# Patient Record
Sex: Female | Born: 1995 | Race: Black or African American | Hispanic: No | Marital: Single | Smoking: Never smoker
Health system: Southern US, Community
[De-identification: ages and names within clinical notes are randomized; demographics above are authoritative.]

## PROBLEM LIST (undated history)

## (undated) DIAGNOSIS — O139 Gestational [pregnancy-induced] hypertension without significant proteinuria, unspecified trimester: Secondary | ICD-10-CM

## (undated) HISTORY — PX: HERNIA REPAIR: SHX51

## (undated) HISTORY — DX: Gestational (pregnancy-induced) hypertension without significant proteinuria, unspecified trimester: O13.9

---

## 2018-01-25 DIAGNOSIS — O09299 Supervision of pregnancy with other poor reproductive or obstetric history, unspecified trimester: Secondary | ICD-10-CM | POA: Insufficient documentation

## 2018-01-25 DIAGNOSIS — R2231 Localized swelling, mass and lump, right upper limb: Secondary | ICD-10-CM | POA: Insufficient documentation

## 2018-01-25 DIAGNOSIS — O09899 Supervision of other high risk pregnancies, unspecified trimester: Secondary | ICD-10-CM | POA: Insufficient documentation

## 2018-01-25 DIAGNOSIS — R7989 Other specified abnormal findings of blood chemistry: Secondary | ICD-10-CM | POA: Insufficient documentation

## 2018-04-06 DIAGNOSIS — R2231 Localized swelling, mass and lump, right upper limb: Secondary | ICD-10-CM | POA: Diagnosis not present

## 2018-04-06 DIAGNOSIS — Z331 Pregnant state, incidental: Secondary | ICD-10-CM | POA: Diagnosis not present

## 2018-06-15 DIAGNOSIS — O2243 Hemorrhoids in pregnancy, third trimester: Secondary | ICD-10-CM | POA: Diagnosis not present

## 2018-06-15 DIAGNOSIS — R2231 Localized swelling, mass and lump, right upper limb: Secondary | ICD-10-CM | POA: Diagnosis not present

## 2018-06-15 DIAGNOSIS — O99013 Anemia complicating pregnancy, third trimester: Secondary | ICD-10-CM | POA: Diagnosis not present

## 2018-06-15 DIAGNOSIS — D649 Anemia, unspecified: Secondary | ICD-10-CM | POA: Diagnosis not present

## 2018-06-15 DIAGNOSIS — E669 Obesity, unspecified: Secondary | ICD-10-CM | POA: Diagnosis not present

## 2018-06-15 DIAGNOSIS — Z3685 Encounter for antenatal screening for Streptococcus B: Secondary | ICD-10-CM | POA: Diagnosis not present

## 2018-06-15 DIAGNOSIS — O09213 Supervision of pregnancy with history of pre-term labor, third trimester: Secondary | ICD-10-CM | POA: Diagnosis not present

## 2018-06-15 DIAGNOSIS — O26893 Other specified pregnancy related conditions, third trimester: Secondary | ICD-10-CM | POA: Diagnosis not present

## 2018-06-15 DIAGNOSIS — O09293 Supervision of pregnancy with other poor reproductive or obstetric history, third trimester: Secondary | ICD-10-CM | POA: Diagnosis not present

## 2018-06-15 DIAGNOSIS — O99213 Obesity complicating pregnancy, third trimester: Secondary | ICD-10-CM | POA: Diagnosis not present

## 2018-06-15 DIAGNOSIS — Z3A37 37 weeks gestation of pregnancy: Secondary | ICD-10-CM | POA: Diagnosis not present

## 2018-06-15 DIAGNOSIS — Z3A36 36 weeks gestation of pregnancy: Secondary | ICD-10-CM | POA: Diagnosis not present

## 2018-06-15 DIAGNOSIS — Z7982 Long term (current) use of aspirin: Secondary | ICD-10-CM | POA: Diagnosis not present

## 2018-06-22 DIAGNOSIS — Z7982 Long term (current) use of aspirin: Secondary | ICD-10-CM | POA: Diagnosis not present

## 2018-06-22 DIAGNOSIS — O2243 Hemorrhoids in pregnancy, third trimester: Secondary | ICD-10-CM | POA: Diagnosis not present

## 2018-06-22 DIAGNOSIS — E669 Obesity, unspecified: Secondary | ICD-10-CM | POA: Diagnosis not present

## 2018-06-22 DIAGNOSIS — O09293 Supervision of pregnancy with other poor reproductive or obstetric history, third trimester: Secondary | ICD-10-CM | POA: Diagnosis not present

## 2018-06-22 DIAGNOSIS — D649 Anemia, unspecified: Secondary | ICD-10-CM | POA: Diagnosis not present

## 2018-06-22 DIAGNOSIS — R2231 Localized swelling, mass and lump, right upper limb: Secondary | ICD-10-CM | POA: Diagnosis not present

## 2018-06-22 DIAGNOSIS — O09213 Supervision of pregnancy with history of pre-term labor, third trimester: Secondary | ICD-10-CM | POA: Diagnosis not present

## 2018-06-22 DIAGNOSIS — Z3A37 37 weeks gestation of pregnancy: Secondary | ICD-10-CM | POA: Diagnosis not present

## 2018-06-22 DIAGNOSIS — O99013 Anemia complicating pregnancy, third trimester: Secondary | ICD-10-CM | POA: Diagnosis not present

## 2018-06-22 DIAGNOSIS — O99213 Obesity complicating pregnancy, third trimester: Secondary | ICD-10-CM | POA: Diagnosis not present

## 2018-06-22 DIAGNOSIS — O26893 Other specified pregnancy related conditions, third trimester: Secondary | ICD-10-CM | POA: Diagnosis not present

## 2018-06-22 DIAGNOSIS — Z3A36 36 weeks gestation of pregnancy: Secondary | ICD-10-CM | POA: Diagnosis not present

## 2018-06-22 DIAGNOSIS — Z3685 Encounter for antenatal screening for Streptococcus B: Secondary | ICD-10-CM | POA: Diagnosis not present

## 2018-07-06 DIAGNOSIS — O09299 Supervision of pregnancy with other poor reproductive or obstetric history, unspecified trimester: Secondary | ICD-10-CM | POA: Insufficient documentation

## 2018-07-07 DIAGNOSIS — Z3A40 40 weeks gestation of pregnancy: Secondary | ICD-10-CM | POA: Diagnosis not present

## 2018-07-07 DIAGNOSIS — O480 Post-term pregnancy: Secondary | ICD-10-CM | POA: Diagnosis not present

## 2018-07-07 DIAGNOSIS — O4103X0 Oligohydramnios, third trimester, not applicable or unspecified: Secondary | ICD-10-CM | POA: Diagnosis not present

## 2018-09-26 DIAGNOSIS — Z7982 Long term (current) use of aspirin: Secondary | ICD-10-CM | POA: Diagnosis not present

## 2018-09-26 DIAGNOSIS — J020 Streptococcal pharyngitis: Secondary | ICD-10-CM | POA: Diagnosis not present

## 2018-11-22 DIAGNOSIS — N760 Acute vaginitis: Secondary | ICD-10-CM | POA: Diagnosis not present

## 2018-11-22 DIAGNOSIS — B9689 Other specified bacterial agents as the cause of diseases classified elsewhere: Secondary | ICD-10-CM | POA: Diagnosis not present

## 2018-11-22 DIAGNOSIS — N72 Inflammatory disease of cervix uteri: Secondary | ICD-10-CM | POA: Diagnosis not present

## 2019-02-28 DIAGNOSIS — H6692 Otitis media, unspecified, left ear: Secondary | ICD-10-CM | POA: Diagnosis not present

## 2019-02-28 DIAGNOSIS — R0981 Nasal congestion: Secondary | ICD-10-CM | POA: Diagnosis not present

## 2019-06-26 DIAGNOSIS — J028 Acute pharyngitis due to other specified organisms: Secondary | ICD-10-CM | POA: Diagnosis not present

## 2019-06-26 DIAGNOSIS — Z131 Encounter for screening for diabetes mellitus: Secondary | ICD-10-CM | POA: Diagnosis not present

## 2019-06-26 DIAGNOSIS — R5383 Other fatigue: Secondary | ICD-10-CM | POA: Diagnosis not present

## 2019-06-26 DIAGNOSIS — Z114 Encounter for screening for human immunodeficiency virus [HIV]: Secondary | ICD-10-CM | POA: Diagnosis not present

## 2019-06-26 DIAGNOSIS — Z0000 Encounter for general adult medical examination without abnormal findings: Secondary | ICD-10-CM | POA: Diagnosis not present

## 2019-06-26 DIAGNOSIS — E559 Vitamin D deficiency, unspecified: Secondary | ICD-10-CM | POA: Diagnosis not present

## 2019-06-26 DIAGNOSIS — Z13220 Encounter for screening for lipoid disorders: Secondary | ICD-10-CM | POA: Diagnosis not present

## 2019-07-10 DIAGNOSIS — R87610 Atypical squamous cells of undetermined significance on cytologic smear of cervix (ASC-US): Secondary | ICD-10-CM | POA: Diagnosis not present

## 2019-07-10 DIAGNOSIS — E559 Vitamin D deficiency, unspecified: Secondary | ICD-10-CM | POA: Diagnosis not present

## 2019-07-10 DIAGNOSIS — Z01419 Encounter for gynecological examination (general) (routine) without abnormal findings: Secondary | ICD-10-CM | POA: Diagnosis not present

## 2019-07-10 DIAGNOSIS — L404 Guttate psoriasis: Secondary | ICD-10-CM | POA: Diagnosis not present

## 2019-07-10 DIAGNOSIS — Z7251 High risk heterosexual behavior: Secondary | ICD-10-CM | POA: Diagnosis not present

## 2019-07-13 DIAGNOSIS — L42 Pityriasis rosea: Secondary | ICD-10-CM | POA: Diagnosis not present

## 2019-07-13 DIAGNOSIS — L300 Nummular dermatitis: Secondary | ICD-10-CM | POA: Diagnosis not present

## 2019-08-11 DIAGNOSIS — R87610 Atypical squamous cells of undetermined significance on cytologic smear of cervix (ASC-US): Secondary | ICD-10-CM | POA: Diagnosis not present

## 2019-08-11 DIAGNOSIS — E559 Vitamin D deficiency, unspecified: Secondary | ICD-10-CM | POA: Diagnosis not present

## 2019-09-21 DIAGNOSIS — H52223 Regular astigmatism, bilateral: Secondary | ICD-10-CM | POA: Diagnosis not present

## 2019-09-21 DIAGNOSIS — H5213 Myopia, bilateral: Secondary | ICD-10-CM | POA: Diagnosis not present

## 2019-10-21 DIAGNOSIS — H5213 Myopia, bilateral: Secondary | ICD-10-CM | POA: Diagnosis not present

## 2019-10-21 DIAGNOSIS — H52223 Regular astigmatism, bilateral: Secondary | ICD-10-CM | POA: Diagnosis not present

## 2019-11-19 DIAGNOSIS — Z6838 Body mass index (BMI) 38.0-38.9, adult: Secondary | ICD-10-CM | POA: Diagnosis not present

## 2019-11-19 DIAGNOSIS — H8110 Benign paroxysmal vertigo, unspecified ear: Secondary | ICD-10-CM | POA: Diagnosis not present

## 2020-02-16 DIAGNOSIS — Z3200 Encounter for pregnancy test, result unknown: Secondary | ICD-10-CM | POA: Diagnosis not present

## 2020-04-30 ENCOUNTER — Ambulatory Visit
Admission: EM | Admit: 2020-04-30 | Discharge: 2020-04-30 | Disposition: A | Discharge status: Home / Self Care | Payer: BC Managed Care – PPO | Attending: Emergency Medicine | Admitting: Emergency Medicine

## 2020-04-30 DIAGNOSIS — K649 Unspecified hemorrhoids: Secondary | ICD-10-CM | POA: Insufficient documentation

## 2020-04-30 DIAGNOSIS — N760 Acute vaginitis: Secondary | ICD-10-CM | POA: Insufficient documentation

## 2020-04-30 MED ORDER — FLUCONAZOLE 200 MG PO TABS: 200.0000 mg | ORAL_TABLET | Freq: Once | ORAL | 0 refills | Status: AC

## 2020-04-30 NOTE — ED Triage Notes (Signed)
Pt c/o white clumpy vaginal discharge with no odor for the past 2 days. Denies urinary difficulty and un protective intercourse.

## 2020-04-30 NOTE — ED Provider Notes (Signed)
[facility]    CSN: [id number] Arrival date & time: 04/30/20  1752      History   Chief Complaint Chief Complaint  Patient presents with  . Vaginal Discharge    HPI Suzanne Elliott is a 24 y.o. female without significant medical history presenting for 2-day course of clumpy vaginal discharge without odor.  Unsure of color.  Does endorse vaginal irritation, pruritus.  LMP 04/17/2020.  Patient currently sexually active with 1 female partner, not routinely using condoms.  Denies vaginal or pelvic pain, abdominal pain, back pain, fever.  No urinary frequency, urgency, dysuria.    History reviewed. No pertinent past medical history.  There are no problems to display for this patient.   History reviewed. No pertinent surgical history.  OB History   No obstetric history on file.      Home Medications    Prior to Admission medications   Not on File    Family History History reviewed. No pertinent family history.  Social History Social History   Tobacco Use  . Smoking status: Never Smoker  . Smokeless tobacco: Never Used  Substance Use Topics  . Alcohol use: Not Currently  . Drug use: Not Currently     Allergies   Patient has no known allergies.   Review of Systems As per HPI   Physical Exam Triage Vital Signs ED Triage Vitals  Enc Vitals Group     BP 04/30/20 1831 (!) 139/94     Pulse Rate 04/30/20 1831 85     Resp 04/30/20 1831 16     Temp 04/30/20 1831 98.3 F (36.8 C)     Temp Source 04/30/20 1831 Oral     SpO2 04/30/20 1831 100 %     Weight --      Height --      Head Circumference --      Peak Flow --      Pain Score 04/30/20 1846 6     Pain Loc --      Pain Edu? --      Excl. in [place]? --    No data found.  Updated Vital Signs BP (!) 139/94 (BP Location: Left Arm)   Pulse 85   Temp 98.3 F (36.8 C) (Oral)   Resp 16   LMP 04/17/2020   SpO2 100%   Visual Acuity Right Eye Distance:   Left Eye Distance:   Bilateral  Distance:    Right Eye Near:   Left Eye Near:    Bilateral Near:     Physical Exam Constitutional:      General: She is not in acute distress. HENT:     Head: Normocephalic and atraumatic.  Eyes:     General: No scleral icterus.    Pupils: Pupils are equal, round, and reactive to light.  Cardiovascular:     Rate and Rhythm: Normal rate.  Pulmonary:     Effort: Pulmonary effort is normal.  Abdominal:     General: Bowel sounds are normal.     Palpations: Abdomen is soft.     Tenderness: There is no abdominal tenderness. There is no right CVA tenderness, left CVA tenderness or guarding.  Genitourinary:    Comments: Patient declined, self-swab performed Skin:    Coloration: Skin is not jaundiced or pale.  Neurological:     Mental Status: She is alert and oriented to person, place, and time.      UC Treatments / Results  Labs (all labs ordered are  listed, but only abnormal results are displayed) Labs Reviewed  CERVICOVAGINAL ANCILLARY ONLY    EKG   Radiology No results found.  Procedures Procedures (including critical care time)  Medications Ordered in UC Medications - No data to display  Initial Impression / Assessment and Plan / UC Course  I have reviewed the triage vital signs and the nursing notes.  Pertinent labs & imaging results that were available during my care of the patient were reviewed by me and considered in my medical decision making (see chart for details).     Patient appears well in office today.  Declined pelvic exam: cytology pending, will treat for yeast vaginitis in the interim given history.  Return precautions discussed, patient verbalized understanding and is agreeable to plan. Final Clinical Impressions(s) / UC Diagnoses   Final diagnoses:  Acute vaginitis  Hemorrhoids, unspecified hemorrhoid type     Discharge Instructions     Take diflucan today, another in 3 days if needed.  Testing for chlamydia, gonorrhea, trichomonas is  pending: please look for these results on the MyChart app/website.  We will notify you if you are positive and outline treatment at that time.  Important to avoid all forms of sexual intercourse (oral, vaginal, anal) with any/all partners for the next 7 days to avoid spreading/reinfecting. Any/all sexual partners should be notified of testing/treatment today.  Return for persistent/worsening symptoms or if you develop fever, abdominal or pelvic pain, blood in your urine, or are re-exposed to an STI.    ED Prescriptions    Medication Sig Dispense Auth. Provider   fluconazole (DIFLUCAN) 200 MG tablet Take 1 tablet (200 mg total) by mouth once for 1 dose. May repeat in 72 hours if needed 2 tablet Hall-Potvin, [place], PA-C     PDMP not reviewed this encounter.   Hall-Potvin, [place], [place] 05/01/20 1245

## 2020-04-30 NOTE — Discharge Instructions (Signed)
Take diflucan today, another in 3 days if needed.  Testing for chlamydia, gonorrhea, trichomonas is pending: please look for these results on the MyChart app/website.  We will notify you if you are positive and outline treatment at that time.  Important to avoid all forms of sexual intercourse (oral, vaginal, anal) with any/all partners for the next 7 days to avoid spreading/reinfecting. Any/all sexual partners should be notified of testing/treatment today.  Return for persistent/worsening symptoms or if you develop fever, abdominal or pelvic pain, blood in your urine, or are re-exposed to an STI.

## 2020-05-01 ENCOUNTER — Encounter: Payer: Self-pay | Admitting: Emergency Medicine

## 2020-05-02 LAB — CERVICOVAGINAL ANCILLARY ONLY
Bacterial Vaginitis (gardnerella): NEGATIVE
Chlamydia: NEGATIVE
Comment: NEGATIVE
Comment: NEGATIVE
Comment: NEGATIVE
Comment: NORMAL
Neisseria Gonorrhea: NEGATIVE
Trichomonas: NEGATIVE

## 2020-05-29 ENCOUNTER — Ambulatory Visit
Admission: EM | Admit: 2020-05-29 | Discharge: 2020-05-29 | Disposition: A | Discharge status: Home / Self Care | Payer: BC Managed Care – PPO | Attending: Physician Assistant | Admitting: Physician Assistant

## 2020-05-29 DIAGNOSIS — R197 Diarrhea, unspecified: Secondary | ICD-10-CM

## 2020-05-29 DIAGNOSIS — R112 Nausea with vomiting, unspecified: Secondary | ICD-10-CM | POA: Diagnosis not present

## 2020-05-29 MED ORDER — DICYCLOMINE HCL 20 MG PO TABS
20.0000 mg | ORAL_TABLET | Freq: Two times a day (BID) | ORAL | 0 refills | Status: DC
Start: 2020-05-29 — End: 2021-02-06

## 2020-05-29 MED ORDER — ONDANSETRON 4 MG PO TBDP
4.0000 mg | ORAL_TABLET | Freq: Three times a day (TID) | ORAL | 0 refills | Status: DC | PRN
Start: 2020-05-29 — End: 2021-02-06

## 2020-05-29 NOTE — ED Provider Notes (Signed)
[facility]    CSN: [id number] Arrival date & time: 05/29/20  0840      History   Chief Complaint Chief Complaint  Patient presents with  . Emesis    HPI Suzanne Elliott is a 24 y.o. female.   24 year old female comes in for 2 day history of nausea/vomiting/diarrhea. NBNB vomiting that was controlled yesterday until trying food intake this morning. Now also having diarrhea without melena, hematochezia. Abdominal soreness to the umbilical area, worse with BM. Denies fevers, URI symptoms. Children with similar symptoms.      History reviewed. No pertinent past medical history.  There are no problems to display for this patient.   History reviewed. No pertinent surgical history.  OB History   No obstetric history on file.      Home Medications    Prior to Admission medications   Medication Sig Start Date End Date Taking? Authorizing Provider  dicyclomine (BENTYL) 20 MG tablet Take 1 tablet (20 mg total) by mouth 2 (two) times daily. 05/29/20   Tasia Catchings, Shatoya Roets V, PA-C  ondansetron (ZOFRAN ODT) 4 MG disintegrating tablet Take 1 tablet (4 mg total) by mouth every 8 (eight) hours as needed for nausea or vomiting. 05/29/20   Ok Edwards, PA-C    Family History History reviewed. No pertinent family history.  Social History Social History   Tobacco Use  . Smoking status: Never Smoker  . Smokeless tobacco: Never Used  Substance Use Topics  . Alcohol use: Not Currently  . Drug use: Not Currently     Allergies   Patient has no known allergies.   Review of Systems Review of Systems  Reason unable to perform ROS: See HPI as above.     Physical Exam Triage Vital Signs ED Triage Vitals  Enc Vitals Group     BP 05/29/20 0845 129/85     Pulse Rate 05/29/20 0845 91     Resp 05/29/20 0845 16     Temp 05/29/20 0845 98.7 F (37.1 C)     Temp Source 05/29/20 0845 Oral     SpO2 05/29/20 0845 98 %     Weight --      Height --      Head Circumference --       Peak Flow --      Pain Score 05/29/20 0852 0     Pain Loc --      Pain Edu? --      Excl. in [place]? --    No data found.  Updated Vital Signs BP 129/85 (BP Location: Right Arm)   Pulse 91   Temp 98.7 F (37.1 C) (Oral)   Resp 16   LMP 05/17/2020   SpO2 98%   Visual Acuity Right Eye Distance:   Left Eye Distance:   Bilateral Distance:    Right Eye Near:   Left Eye Near:    Bilateral Near:     Physical Exam Constitutional:      General: She is not in acute distress.    Appearance: She is well-developed. She is not ill-appearing, toxic-appearing or diaphoretic.  HENT:     Head: Normocephalic and atraumatic.  Eyes:     Conjunctiva/sclera: Conjunctivae normal.     Pupils: Pupils are equal, round, and reactive to light.  Cardiovascular:     Rate and Rhythm: Normal rate and regular rhythm.  Pulmonary:     Effort: Pulmonary effort is normal. No respiratory distress.     Comments:  LCTAB Abdominal:     General: Bowel sounds are normal.     Palpations: Abdomen is soft.     Tenderness: There is generalized abdominal tenderness. There is no guarding or rebound.  Musculoskeletal:     Cervical back: Normal range of motion and neck supple.  Skin:    General: Skin is warm and dry.  Neurological:     Mental Status: She is alert and oriented to person, place, and time.  Psychiatric:        Behavior: Behavior normal.        Judgment: Judgment normal.      UC Treatments / Results  Labs (all labs ordered are listed, but only abnormal results are displayed) Labs Reviewed - No data to display  EKG   Radiology No results found.  Procedures Procedures (including critical care time)  Medications Ordered in UC Medications - No data to display  Initial Impression / Assessment and Plan / UC Course  I have reviewed the triage vital signs and the nursing notes.  Pertinent labs & imaging results that were available during my care of the patient were reviewed by me and  considered in my medical decision making (see chart for details).    Discussed with patient no alarming signs on exam. Zofran, bentyl PRN. Push fluids. Bland diet, advance as tolerated. Return precautions given.  Final Clinical Impressions(s) / UC Diagnoses   Final diagnoses:  Nausea vomiting and diarrhea   ED Prescriptions    Medication Sig Dispense Auth. Provider   ondansetron (ZOFRAN ODT) 4 MG disintegrating tablet Take 1 tablet (4 mg total) by mouth every 8 (eight) hours as needed for nausea or vomiting. 20 tablet Julietta Batterman V, PA-C   dicyclomine (BENTYL) 20 MG tablet Take 1 tablet (20 mg total) by mouth 2 (two) times daily. 20 tablet Belinda Fisher, PA-C     PDMP not reviewed this encounter.   Belinda Fisher, PA-C 05/29/20 [phone]

## 2020-05-29 NOTE — ED Triage Notes (Signed)
Pt states vomiting x2 yesterday, once this am and now having diarrhea.

## 2020-05-29 NOTE — Discharge Instructions (Signed)
Zofran for nausea and vomiting as needed. Bentyl for abdominal cramping/diarrhea as needed. Keep hydrated, you urine should be clear to pale yellow in color. Bland diet, advance as tolerated. Monitor for any worsening of symptoms, nausea or vomiting not controlled by medication, worsening abdominal pain, fever, go to the emergency department for further evaluation needed.

## 2020-07-20 ENCOUNTER — Ambulatory Visit
Admission: EM | Admit: 2020-07-20 | Discharge: 2020-07-20 | Disposition: A | Discharge status: Home / Self Care | Payer: BC Managed Care – PPO | Attending: Physician Assistant | Admitting: Physician Assistant

## 2020-07-20 ENCOUNTER — Encounter: Payer: Self-pay | Admitting: Emergency Medicine

## 2020-07-20 ENCOUNTER — Other Ambulatory Visit: Payer: Self-pay

## 2020-07-20 DIAGNOSIS — H1031 Unspecified acute conjunctivitis, right eye: Secondary | ICD-10-CM

## 2020-07-20 MED ORDER — OFLOXACIN 0.3 % OP SOLN: OPHTHALMIC | 0 refills | Status: DC

## 2020-07-20 NOTE — ED Triage Notes (Signed)
Pt here with right eye irritation and watering x 2 days

## 2020-07-20 NOTE — Discharge Instructions (Signed)
Use ofloxacin eyedrops as directed on right eye. Artificial tear gel (systane, genteal) at night. Wait 10-15 minutes between drops, always use artificial tear gel last, as it prevents drops from penetrating through. Monitor for any worsening of symptoms, changes in vision, sensitivity to light, eye swelling, painful eye movement, follow up with ophthalmology for further evaluation.

## 2020-07-20 NOTE — ED Provider Notes (Signed)
[facility]    CSN: [id number] Arrival date & time: 07/20/20  1148      History   Chief Complaint Chief Complaint  Patient presents with  . Conjunctivitis    HPI Suzanne Elliott is a 24 y.o. female.   24 year old female comes in for 2 day history of right eye irritation, redness, crusting. Denies vision changes, photophobia. Denies URI symptoms, fever. Wears contacts, monthly, current pair approx 20 days in. Does not wear over night.      History reviewed. No pertinent past medical history.  There are no problems to display for this patient.   History reviewed. No pertinent surgical history.  OB History   No obstetric history on file.      Home Medications    Prior to Admission medications   Medication Sig Start Date End Date Taking? Authorizing Provider  dicyclomine (BENTYL) 20 MG tablet Take 1 tablet (20 mg total) by mouth 2 (two) times daily. 05/29/20   Cathie Hoops, [place] V, PA-C  ofloxacin (OCUFLOX) 0.3 % ophthalmic solution 1 drop every 2 hrs for 2 days, then 4 times a day for the next 5 days 07/20/20   Cathie Hoops, Naba Sneed V, PA-C  ondansetron (ZOFRAN ODT) 4 MG disintegrating tablet Take 1 tablet (4 mg total) by mouth every 8 (eight) hours as needed for nausea or vomiting. 05/29/20   Belinda Fisher, PA-C    Family History Family History  Family history unknown: Yes    Social History Social History   Tobacco Use  . Smoking status: Never Smoker  . Smokeless tobacco: Never Used  Substance Use Topics  . Alcohol use: Not Currently  . Drug use: Not Currently     Allergies   Patient has no known allergies.   Review of Systems Review of Systems  Reason unable to perform ROS: See HPI as above.     Physical Exam Triage Vital Signs ED Triage Vitals  Enc Vitals Group     BP 07/20/20 1201 (!) 136/94     Pulse Rate 07/20/20 1201 81     Resp 07/20/20 1201 18     Temp 07/20/20 1201 98.7 F (37.1 C)     Temp Source 07/20/20 1201 Oral     SpO2 07/20/20 1201 98 %      Weight --      Height --      Head Circumference --      Peak Flow --      Pain Score 07/20/20 1205 3     Pain Loc --      Pain Edu? --      Excl. in [place]? --    No data found.  Updated Vital Signs BP (!) 136/94 (BP Location: Left Arm)   Pulse 81   Temp 98.7 F (37.1 C) (Oral)   Resp 18   SpO2 98%   Visual Acuity Right Eye Distance: 20/25 Left Eye Distance: 20/20 Bilateral Distance: 20/20  Right Eye Near:   Left Eye Near:    Bilateral Near:     Physical Exam Constitutional:      General: She is not in acute distress.    Appearance: She is well-developed. She is not ill-appearing, toxic-appearing or diaphoretic.  HENT:     Head: Normocephalic and atraumatic.  Eyes:     General: Lids are normal. Lids are everted, no foreign bodies appreciated.     Extraocular Movements: Extraocular movements intact.     Conjunctiva/sclera:  Right eye: Right conjunctiva is injected.     Left eye: Left conjunctiva is not injected.     Pupils: Pupils are equal, round, and reactive to light.     Comments: Fluorescein stain without uptake.  Neurological:     Mental Status: She is alert and oriented to person, place, and time.      UC Treatments / Results  Labs (all labs ordered are listed, but only abnormal results are displayed) Labs Reviewed - No data to display  EKG   Radiology No results found.  Procedures Procedures (including critical care time)  Medications Ordered in UC Medications - No data to display  Initial Impression / Assessment and Plan / UC Course  I have reviewed the triage vital signs and the nursing notes.  Pertinent labs & imaging results that were available during my care of the patient were reviewed by me and considered in my medical decision making (see chart for details).    Start ofloxacin drops as directed. Artificial tears gel as directed. Patient to refrain from contact lens use until symptoms resolve, to switch to new pair of contacts when  restarting. Patient to follow up with ophthalmology if symptoms worsens or does not improve. Return precautions given.   Final Clinical Impressions(s) / UC Diagnoses   Final diagnoses:  Acute bacterial conjunctivitis of right eye    ED Prescriptions    Medication Sig Dispense Auth. Provider   ofloxacin (OCUFLOX) 0.3 % ophthalmic solution 1 drop every 2 hrs for 2 days, then 4 times a day for the next 5 days 5 mL Belinda Fisher, PA-C     PDMP not reviewed this encounter.   Belinda Fisher, PA-C 07/20/20 1320

## 2020-08-13 DIAGNOSIS — Z114 Encounter for screening for human immunodeficiency virus [HIV]: Secondary | ICD-10-CM | POA: Diagnosis not present

## 2020-08-13 DIAGNOSIS — Z23 Encounter for immunization: Secondary | ICD-10-CM | POA: Diagnosis not present

## 2020-08-13 DIAGNOSIS — Z131 Encounter for screening for diabetes mellitus: Secondary | ICD-10-CM | POA: Diagnosis not present

## 2020-08-13 DIAGNOSIS — E559 Vitamin D deficiency, unspecified: Secondary | ICD-10-CM | POA: Diagnosis not present

## 2020-08-13 DIAGNOSIS — Z0000 Encounter for general adult medical examination without abnormal findings: Secondary | ICD-10-CM | POA: Diagnosis not present

## 2020-08-13 DIAGNOSIS — R5383 Other fatigue: Secondary | ICD-10-CM | POA: Diagnosis not present

## 2020-08-13 DIAGNOSIS — Z13220 Encounter for screening for lipoid disorders: Secondary | ICD-10-CM | POA: Diagnosis not present

## 2020-08-24 DIAGNOSIS — R0602 Shortness of breath: Secondary | ICD-10-CM | POA: Diagnosis not present

## 2020-08-24 DIAGNOSIS — E559 Vitamin D deficiency, unspecified: Secondary | ICD-10-CM | POA: Diagnosis not present

## 2020-08-24 DIAGNOSIS — Z79899 Other long term (current) drug therapy: Secondary | ICD-10-CM | POA: Diagnosis not present

## 2020-08-24 DIAGNOSIS — R635 Abnormal weight gain: Secondary | ICD-10-CM | POA: Diagnosis not present

## 2020-08-24 DIAGNOSIS — L089 Local infection of the skin and subcutaneous tissue, unspecified: Secondary | ICD-10-CM | POA: Diagnosis not present

## 2020-08-24 DIAGNOSIS — E0590 Thyrotoxicosis, unspecified without thyrotoxic crisis or storm: Secondary | ICD-10-CM | POA: Diagnosis not present

## 2020-10-14 ENCOUNTER — Emergency Department (HOSPITAL_BASED_OUTPATIENT_CLINIC_OR_DEPARTMENT_OTHER): Payer: BC Managed Care – PPO

## 2020-10-14 ENCOUNTER — Encounter (HOSPITAL_COMMUNITY): Payer: Self-pay

## 2020-10-14 ENCOUNTER — Other Ambulatory Visit: Payer: Self-pay

## 2020-10-14 ENCOUNTER — Emergency Department (HOSPITAL_COMMUNITY)
Admission: EM | Admit: 2020-10-14 | Discharge: 2020-10-14 | Disposition: A | Discharge status: Home / Self Care | Payer: BC Managed Care – PPO | Attending: Emergency Medicine | Admitting: Emergency Medicine

## 2020-10-14 DIAGNOSIS — M79652 Pain in left thigh: Secondary | ICD-10-CM | POA: Diagnosis not present

## 2020-10-14 DIAGNOSIS — M7989 Other specified soft tissue disorders: Secondary | ICD-10-CM

## 2020-10-14 NOTE — Progress Notes (Signed)
Left lower extremity venous duplex has been completed. Preliminary results can be found in CV Proc through chart review.  Results were given to Dr. Clarene Duke.  10/14/20 11:26 AM Olen Cordial RVT

## 2020-10-14 NOTE — ED Triage Notes (Signed)
Pt was bit by a spider on her left knee 2 weeks ago, seen at an UC and given antibiotic in which she has finished. Pt now c.o pain traveling up her leg into her thigh for the past week. Pt ambulatory.

## 2020-10-14 NOTE — ED Notes (Signed)
Pt discharged to home and instructed to follow up with primary care. Pt verbalized understanding of written and verbal discharge instructions provided and all questions addressed. Pt ambulated out of ER with steady gait; no distress noted.  

## 2020-10-14 NOTE — ED Notes (Signed)
Ultrasound done at bedside

## 2020-10-14 NOTE — ED Provider Notes (Signed)
[facility] EMERGENCY DEPARTMENT Provider Note   CSN: [id number] Arrival date & time: 10/14/20  5397     History Chief Complaint  Patient presents with  . Leg Pain    Suzanne Elliott is a 24 y.o. female.  [name] who p/w L leg pain. Pt states that at the beginning of September, she had a "spider bite" on her L leg near her knee. She was seen at [facility] and given antibiotics; was re-evaluated later and they extended the antibiotic course. This area has since healed up at she finished abx ~2 weeks ago. Over the past week, she has had progressively worsening pain in her left leg involving thigh that is worse w/ ambulation. She denies any distal pain or numbness. No fevers or recent illness. Denies h/o blood clots. Is on OCPs.  The history is provided by the patient.  Leg Pain Associated symptoms: no fever        History reviewed. No pertinent past medical history.  There are no problems to display for this patient.   Past Surgical History:  Procedure Laterality Date  . HERNIA REPAIR     in third grade     OB History   No obstetric history on file.     Family History  Family history unknown: Yes    Social History   Tobacco Use  . Smoking status: Never Smoker  . Smokeless tobacco: Never Used  Substance Use Topics  . Alcohol use: Not Currently  . Drug use: Not Currently    Home Medications Prior to Admission medications   Medication Sig Start Date End Date Taking? Authorizing Provider  dicyclomine (BENTYL) 20 MG tablet Take 1 tablet (20 mg total) by mouth 2 (two) times daily. 05/29/20   Cathie Hoops, Amy V, PA-C  ofloxacin (OCUFLOX) 0.3 % ophthalmic solution 1 drop every 2 hrs for 2 days, then 4 times a day for the next 5 days 07/20/20   Cathie Hoops, Amy V, PA-C  ondansetron (ZOFRAN ODT) 4 MG disintegrating tablet Take 1 tablet (4 mg total) by mouth every 8 (eight) hours as needed for nausea or vomiting. 05/29/20   Belinda Fisher, PA-C    Allergies    Patient has no known  allergies.  Review of Systems   Review of Systems  Constitutional: Negative for fever.  Musculoskeletal: Positive for gait problem. Negative for joint swelling.  Skin: Negative for rash.  All other systems reviewed and are negative.   Physical Exam Updated Vital Signs BP 136/87 (BP Location: Left Arm)   Pulse 78   Temp (!) 97.2 F (36.2 C) (Temporal)   Resp 16   Ht 5\' 3"  (1.6 m)   Wt 97.5 kg   LMP 10/08/2020   SpO2 100%   BMI 38.09 kg/m   Physical Exam Vitals and nursing note reviewed.  Constitutional:      General: She is not in acute distress.    Appearance: She is well-developed.  HENT:     Head: Normocephalic and atraumatic.  Eyes:     Conjunctiva/sclera: Conjunctivae normal.  Musculoskeletal:     Cervical back: Neck supple.     Comments: No left leg swelling, no calf tenderness, healed circular scar lateral to L knee with no erythema, fluctuance, or induration; no skin changes on thigh; thigh is generally tender to palpation without asymmetry compared to R  Skin:    General: Skin is warm and dry.  Neurological:     Mental Status: She is alert and  oriented to person, place, and time.     Sensory: No sensory deficit.  Psychiatric:        Judgment: Judgment normal.     ED Results / Procedures / Treatments   Labs (all labs ordered are listed, but only abnormal results are displayed) Labs Reviewed - No data to display  EKG None  Radiology No results found.  Procedures Procedures (including critical care time)  Medications Ordered in ED Medications - No data to display  ED Course  I have reviewed the triage vital signs and the nursing notes.  Pertinent imaging results that were available during my care of the patient were reviewed by me and considered in my medical decision making (see chart for details).    MDM Rules/Calculators/A&P                          Pt without signs of deep space infection/cellulitis/myositis. DDx includes DVT, obtained  US which was normal. Discussed supportive measures for musculoskeletal pain including elevation, rest, stretching, NSAIDs. Reviewed return precautions especially regarding signs of infection.  Final Clinical Impression(s) / ED Diagnoses Final diagnoses:  Left thigh pain    Rx / DC Orders ED Discharge Orders    None       Syanna Remmert, Ambrose [place], MD 10/14/20 1131

## 2020-10-15 ENCOUNTER — Telehealth: Payer: Self-pay

## 2020-10-15 NOTE — Telephone Encounter (Signed)
Transition Care Management Follow-up Telephone Call  Date of discharge and from where: [facility] 10/14/2020  How have you been since you were released from the hospital? Doing better  Any questions or concerns? No  Items Reviewed:  Did the pt receive and understand the discharge instructions provided? Yes   Medications obtained and verified? Yes   Other? No   Any new allergies since your discharge? No   Dietary orders reviewed? Yes  Do you have support at home? Yes   Home Care and Equipment/Supplies: Were home health services ordered? not applicable If so, what is the name of the agency?   Has the agency set up a time to come to the patient's home? not applicable Were any new equipment or medical supplies ordered?  No What is the name of the medical supply agency?  Were you able to get the supplies/equipment? not applicable Do you have any questions related to the use of the equipment or supplies? No  Functional Questionnaire: (I = Independent and D = Dependent) ADLs: I  Bathing/Dressing- I  Meal Prep- I  Eating- I  Maintaining continence- I  Transferring/Ambulation- I  Managing Meds- I  Follow up appointments reviewed:   PCP Hospital f/u appt confirmed? Yes  Pt stated PCP was at [facility] f/u appt confirmed? No    Are transportation arrangements needed? No   If their condition worsens, is the pt aware to call PCP or go to the Emergency Dept.? Yes  Was the patient provided with contact information for the PCP's office or ED? Yes  Was to pt encouraged to call back with questions or concerns? Yes

## 2021-02-06 ENCOUNTER — Ambulatory Visit
Admission: EM | Admit: 2021-02-06 | Discharge: 2021-02-06 | Disposition: A | Discharge status: Home / Self Care | Payer: BC Managed Care – PPO | Attending: Emergency Medicine | Admitting: Emergency Medicine

## 2021-02-06 ENCOUNTER — Other Ambulatory Visit: Payer: Self-pay

## 2021-02-06 DIAGNOSIS — J029 Acute pharyngitis, unspecified: Secondary | ICD-10-CM | POA: Insufficient documentation

## 2021-02-06 LAB — POCT RAPID STREP A (OFFICE): Rapid Strep A Screen: NEGATIVE

## 2021-02-06 MED ORDER — IBUPROFEN 800 MG PO TABS
800.0000 mg | ORAL_TABLET | Freq: Three times a day (TID) | ORAL | 0 refills | Status: DC
Start: 2021-02-06 — End: 2021-09-28

## 2021-02-06 MED ORDER — CETIRIZINE HCL 10 MG PO CAPS
10.0000 mg | ORAL_CAPSULE | Freq: Every day | ORAL | 0 refills | Status: DC
Start: 2021-02-06 — End: 2021-10-24

## 2021-02-06 NOTE — ED Provider Notes (Signed)
[facility]    CSN: [id number] Arrival date & time: 02/06/21  0845      History   Chief Complaint No chief complaint on file. Sore throat  HPI Suzanne Elliott is a 25 y.o. female presenting today for evaluation sore throat.  Reports sore throat over the past 3 days.  Has had associated ear pain.  Denies associated nausea vomiting or diarrhea.  Denies associated cough or congestion.  Reports a lot of throat pain and swelling.  Reports her tonsils are larger at baseline and reports history of tonsil stones.  Feels similar to when she has previously had tonsillitis.  Denies fevers.  HPI  History reviewed. No pertinent past medical history.  There are no problems to display for this patient.   Past Surgical History:  Procedure Laterality Date  . HERNIA REPAIR     in third grade    OB History   No obstetric history on file.      Home Medications    Prior to Admission medications   Medication Sig Start Date End Date Taking? Authorizing Provider  Cetirizine HCl 10 MG CAPS Take 1 capsule (10 mg total) by mouth daily for 10 days. 02/06/21 02/16/21 Yes Dalayla Aldredge C, PA-C  ibuprofen (ADVIL) 800 MG tablet Take 1 tablet (800 mg total) by mouth 3 (three) times daily. 02/06/21  Yes Starlyn Droge C, PA-C  dicyclomine (BENTYL) 20 MG tablet Take 1 tablet (20 mg total) by mouth 2 (two) times daily. 05/29/20 02/06/21  Belinda Fisher, PA-C    Family History Family History  Problem Relation Age of Onset  . Asthma Mother   . Heart failure Father   . Hypertension Father     Social History Social History   Tobacco Use  . Smoking status: Never Smoker  . Smokeless tobacco: Never Used  Substance Use Topics  . Alcohol use: Not Currently  . Drug use: Not Currently     Allergies   Patient has no known allergies.   Review of Systems Review of Systems  Constitutional: Negative for activity change, appetite change, chills, fatigue and fever.  HENT: Positive for sore  throat. Negative for congestion, ear pain, rhinorrhea, sinus pressure and trouble swallowing.   Eyes: Negative for discharge and redness.  Respiratory: Negative for cough, chest tightness and shortness of breath.   Cardiovascular: Negative for chest pain.  Gastrointestinal: Negative for abdominal pain, diarrhea, nausea and vomiting.  Musculoskeletal: Negative for myalgias.  Skin: Negative for rash.  Neurological: Negative for dizziness, light-headedness and headaches.     Physical Exam Triage Vital Signs ED Triage Vitals  Enc Vitals Group     BP      Pulse      Resp      Temp      Temp src      SpO2      Weight      Height      Head Circumference      Peak Flow      Pain Score      Pain Loc      Pain Edu?      Excl. in GC?    No data found.  Updated Vital Signs BP (S) (!) 155/91 (BP Location: Left Arm) Comment: Baseline per report  Pulse 98   Temp 98.2 F (36.8 C) (Oral)   Resp 16   LMP 01/27/2021   SpO2 97%   Visual Acuity Right Eye Distance:   Left Eye Distance:  Bilateral Distance:    Right Eye Near:   Left Eye Near:    Bilateral Near:     Physical Exam Vitals and nursing note reviewed.  Constitutional:      Appearance: She is well-developed and well-nourished.     Comments: No acute distress  HENT:     Head: Normocephalic and atraumatic.     Ears:     Comments: Bilateral ears without tenderness to palpation of external auricle, tragus and mastoid, EAC's without erythema or swelling, TM's with good bony landmarks and cone of light. Non erythematous.     Nose: Nose normal.     Mouth/Throat:     Comments: Oral mucosa pink and moist, bilateral tonsils appear large, but do not appear erythematous and without exudate, posterior pharynx patent and nonerythematous, no uvula deviation or swelling. Normal phonation. Eyes:     Conjunctiva/sclera: Conjunctivae normal.  Neck:     Comments: Bilateral tonsillar lymphadenopathy with tenderness to palpation, full  active range of motion of neck Cardiovascular:     Rate and Rhythm: Normal rate and regular rhythm.  Pulmonary:     Effort: Pulmonary effort is normal. No respiratory distress.     Comments: Breathing comfortably at rest, CTABL, no wheezing, rales or other adventitious sounds auscultated Abdominal:     General: There is no distension.  Musculoskeletal:        General: Normal range of motion.     Cervical back: Neck supple.  Skin:    General: Skin is warm and dry.  Neurological:     Mental Status: She is alert and oriented to person, place, and time.  Psychiatric:        Mood and Affect: Mood and affect normal.      UC Treatments / Results  Labs (all labs ordered are listed, but only abnormal results are displayed) Labs Reviewed  NOVEL CORONAVIRUS, NAA  CULTURE, GROUP A STREP [facility])  POCT RAPID STREP A (OFFICE)    EKG   Radiology No results found.  Procedures Procedures (including critical care time)  Medications Ordered in UC Medications - No data to display  Initial Impression / Assessment and Plan / UC Course  I have reviewed the triage vital signs and the nursing notes.  Pertinent labs & imaging results that were available during my care of the patient were reviewed by me and considered in my medical decision making (see chart for details).     Strep negative, Covid pending, suspect likely viral URI and recommending symptomatic and supportive care.  Does not appear suggestive of tonsillitis or peritonsillar abscess at this time.  Recommending anti-inflammatories warm compresses and antihistamine for any postnasal drainage contributing to throat irritation.  Discussed strict return precautions. Patient verbalized understanding and is agreeable with plan.  Final Clinical Impressions(s) / UC Diagnoses   Final diagnoses:  Sore throat     Discharge Instructions     Sore Throat  Your rapid strep tested Negative today. Covid test pending.  Please continue  Tylenol or Ibuprofen for fever and pain. May try salt water gargles, cepacol lozenges, throat spray, or OTC cold relief medicine for throat discomfort. If you also have congestion take a daily anti-histamine like Zyrtec, Claritin, and a oral decongestant to help with post nasal drip that may be irritating your throat.   Stay hydrated and drink plenty of fluids to keep your throat coated relieve irritation.     ED Prescriptions    Medication Sig Dispense Auth. Provider   ibuprofen (  ADVIL) 800 MG tablet Take 1 tablet (800 mg total) by mouth 3 (three) times daily. 21 tablet Elivia Robotham C, PA-C   Cetirizine HCl 10 MG CAPS Take 1 capsule (10 mg total) by mouth daily for 10 days. 10 capsule Zeanna Sunde, [place] C, PA-C     PDMP not reviewed this encounter.   Lulamae Skorupski, J.F. Villareal C, PA-C 02/06/21 1120

## 2021-02-06 NOTE — Discharge Instructions (Addendum)
Sore Throat  Your rapid strep tested Negative today. Covid test pending.  Please continue Tylenol or Ibuprofen for fever and pain. May try salt water gargles, cepacol lozenges, throat spray, or OTC cold relief medicine for throat discomfort. If you also have congestion take a daily anti-histamine like Zyrtec, Claritin, and a oral decongestant to help with post nasal drip that may be irritating your throat.   Stay hydrated and drink plenty of fluids to keep your throat coated relieve irritation.

## 2021-02-06 NOTE — ED Triage Notes (Addendum)
Patient presents to Urgent Care with complaints of sore throat and left ear pain since Monday.  Denies fever, n/v, or diarrhea.

## 2021-02-08 LAB — NOVEL CORONAVIRUS, NAA: SARS-CoV-2, NAA: NOT DETECTED

## 2021-02-08 LAB — SARS-COV-2, NAA 2 DAY TAT

## 2021-02-09 LAB — CULTURE, GROUP A STREP (THRC)

## 2021-02-24 DIAGNOSIS — Z0289 Encounter for other administrative examinations: Secondary | ICD-10-CM | POA: Diagnosis not present

## 2021-02-24 DIAGNOSIS — Z111 Encounter for screening for respiratory tuberculosis: Secondary | ICD-10-CM | POA: Diagnosis not present

## 2021-02-24 DIAGNOSIS — E669 Obesity, unspecified: Secondary | ICD-10-CM | POA: Diagnosis not present

## 2021-02-24 DIAGNOSIS — E0590 Thyrotoxicosis, unspecified without thyrotoxic crisis or storm: Secondary | ICD-10-CM | POA: Diagnosis not present

## 2021-02-26 DIAGNOSIS — Z111 Encounter for screening for respiratory tuberculosis: Secondary | ICD-10-CM | POA: Diagnosis not present

## 2021-03-03 DIAGNOSIS — E669 Obesity, unspecified: Secondary | ICD-10-CM | POA: Diagnosis not present

## 2021-03-03 DIAGNOSIS — E0590 Thyrotoxicosis, unspecified without thyrotoxic crisis or storm: Secondary | ICD-10-CM | POA: Diagnosis not present

## 2021-03-03 DIAGNOSIS — Z111 Encounter for screening for respiratory tuberculosis: Secondary | ICD-10-CM | POA: Diagnosis not present

## 2021-03-03 DIAGNOSIS — Z0289 Encounter for other administrative examinations: Secondary | ICD-10-CM | POA: Diagnosis not present

## 2021-04-18 ENCOUNTER — Encounter: Payer: Self-pay | Admitting: Emergency Medicine

## 2021-04-18 ENCOUNTER — Ambulatory Visit
Admission: EM | Admit: 2021-04-18 | Discharge: 2021-04-18 | Disposition: A | Discharge status: Home / Self Care | Payer: Medicaid Other | Attending: Internal Medicine | Admitting: Internal Medicine

## 2021-04-18 ENCOUNTER — Other Ambulatory Visit: Payer: Self-pay

## 2021-04-18 DIAGNOSIS — T300 Burn of unspecified body region, unspecified degree: Secondary | ICD-10-CM

## 2021-04-18 MED ORDER — SILVER SULFADIAZINE 1 % EX CREA
1.0000 | TOPICAL_CREAM | Freq: Every day | CUTANEOUS | 0 refills | Status: DC
Start: 2021-04-18 — End: 2021-09-28
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
## 2021-04-18 NOTE — Discharge Instructions (Signed)
Apply the ointment Do not break the blisters If you notice redness, increase purulence or swelling please return to urgent care to be reevaluated.
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
## 2021-07-31 ENCOUNTER — Other Ambulatory Visit: Payer: Self-pay

## 2021-07-31 ENCOUNTER — Encounter: Payer: Self-pay | Admitting: Emergency Medicine

## 2021-07-31 ENCOUNTER — Ambulatory Visit
Admission: EM | Admit: 2021-07-31 | Discharge: 2021-07-31 | Disposition: A | Discharge status: Home / Self Care | Payer: BC Managed Care – PPO | Attending: Emergency Medicine | Admitting: Emergency Medicine

## 2021-07-31 DIAGNOSIS — H1032 Unspecified acute conjunctivitis, left eye: Secondary | ICD-10-CM

## 2021-07-31 MED ORDER — POLYMYXIN B-TRIMETHOPRIM 10000-0.1 UNIT/ML-% OP SOLN
1.0000 [drp] | OPHTHALMIC | 0 refills | Status: DC
Start: 2021-07-31 — End: 2021-09-28

## 2021-07-31 NOTE — ED Triage Notes (Signed)
Patient noticed left eye redness yesterday after work, matted when awakening, itching, drainage, painful.  No injury to the eye.  Some blurred vision.  Patient does wear contact lenses.

## 2021-07-31 NOTE — ED Provider Notes (Signed)
[facility]    CSN: [id number] Arrival date & time: 07/31/21  0837      History   Chief Complaint Chief Complaint  Patient presents with   Conjunctivitis    HPI Suzanne Elliott is a 25 y.o. female.   Patient presents with left eye redness, drainage with crusting, tenderness and itching and yesterday evening.  Endorses mild blurred vision.  Wears contacts, to be changed monthly.  Has not attempted treatment.  Denies sensitivity to light, involvement of the right hand, fever, chills, URI symptoms.  Denies seasonal allergies.   History reviewed. No pertinent past medical history.  There are no problems to display for this patient.   Past Surgical History:  Procedure Laterality Date   HERNIA REPAIR     in third grade    OB History   No obstetric history on file.      Home Medications    Prior to Admission medications   Medication Sig Start Date End Date Taking? Authorizing Provider  ibuprofen (ADVIL) 800 MG tablet Take 1 tablet (800 mg total) by mouth 3 (three) times daily. 02/06/21  Yes Wieters, Hallie C, PA-C  trimethoprim-polymyxin b (POLYTRIM) ophthalmic solution Place 1 drop into the left eye every 4 (four) hours. 07/31/21  Yes Foch Rosenwald R, NP  Cetirizine HCl 10 MG CAPS Take 1 capsule (10 mg total) by mouth daily for 10 days. 02/06/21 02/16/21  Wieters, Hallie C, PA-C  silver sulfADIAZINE (SILVADENE) 1 % cream Apply 1 application topically daily. 04/18/21   Lamptey, Britta Mccreedy, MD  dicyclomine (BENTYL) 20 MG tablet Take 1 tablet (20 mg total) by mouth 2 (two) times daily. 05/29/20 02/06/21  Belinda Fisher, PA-C    Family History Family History  Problem Relation Age of Onset   Asthma Mother    Heart failure Father    Hypertension Father     Social History Social History   Tobacco Use   Smoking status: Never   Smokeless tobacco: Never  Substance Use Topics   Alcohol use: Not Currently   Drug use: Not Currently     Allergies   Patient has no  known allergies.   Review of Systems Review of Systems  Constitutional: Negative.   HENT: Negative.    Eyes:  Positive for pain, discharge, redness, itching and visual disturbance. Negative for photophobia.  Respiratory: Negative.    Cardiovascular: Negative.   Skin: Negative.   Neurological: Negative.     Physical Exam Triage Vital Signs ED Triage Vitals  Enc Vitals Group     BP 07/31/21 0911 (!) 137/94     Pulse Rate 07/31/21 0911 66     Resp 07/31/21 0911 18     Temp 07/31/21 0911 98 F (36.7 C)     Temp Source 07/31/21 0911 Oral     SpO2 07/31/21 0911 98 %     Weight 07/31/21 0913 220 lb (99.8 kg)     Height 07/31/21 0913 5\' 3"  (1.6 m)     Head Circumference --      Peak Flow --      Pain Score 07/31/21 0913 5     Pain Loc --      Pain Edu? --      Excl. in GC? --    No data found.  Updated Vital Signs BP (!) 137/94 (BP Location: Left Arm)   Pulse 66   Temp 98 F (36.7 C) (Oral)   Resp 18   Ht 5\' 3"  (1.6 m)  Wt 220 lb (99.8 kg)   LMP 07/30/2021   SpO2 98%   BMI 38.97 kg/m   Visual Acuity Right Eye Distance: 20/25 w/corrective wear Left Eye Distance: 20/50 w/corrective wear Bilateral Distance: 20/30 w/corrective wear  Right Eye Near:   Left Eye Near:    Bilateral Near:     Physical Exam Constitutional:      Appearance: Normal appearance. She is normal weight.  HENT:     Head: Normocephalic.     Right Ear: Tympanic membrane, ear canal and external ear normal.     Left Ear: Tympanic membrane, ear canal and external ear normal.     Nose: Nose normal.  Eyes:     General: Lids are normal. Vision grossly intact. Gaze aligned appropriately.     Comments: Erythema present in the right conjunctiva and the medial sclera, no discharge present at time of exam, vision intact  Pulmonary:     Effort: Pulmonary effort is normal.  Neurological:     Mental Status: She is alert and oriented to person, place, and time. Mental status is at baseline.   Psychiatric:        Mood and Affect: Mood normal.        Behavior: Behavior normal.     UC Treatments / Results  Labs (all labs ordered are listed, but only abnormal results are displayed) Labs Reviewed - No data to display  EKG   Radiology No results found.  Procedures Procedures (including critical care time)  Medications Ordered in UC Medications - No data to display  Initial Impression / Assessment and Plan / UC Course  I have reviewed the triage vital signs and the nursing notes.  Pertinent labs & imaging results that were available during my care of the patient were reviewed by me and considered in my medical decision making (see chart for details).  Acute bacterial conjunctivitis to Vitas of left eye  1.  Polytrim 1 drop left eye every 4 hours while awake for 7 days, patient may use in right eye if symptoms begin to occur 2.  Cool compresses to remove discharge 3.  Oral antihistamines for itching as needed 4.  Follow-up with ophthalmology for persistent symptoms past several day use of medication Final Clinical Impressions(s) / UC Diagnoses   Final diagnoses:  Acute bacterial conjunctivitis of left eye     Discharge Instructions      Today you are being treated for bacterial conjunctivitis also known as pinkeye  When you arrive home remove contacts and dispose of them, wear glasses until symptoms resolve and treatment is complete  Place 1 drop into your left eye every 4 hours while awake for the next 7 days, if symptoms start to occur in your right eye you may use medication there as well, just ensure that the tip of the dropper does not touch your eyes  You can remove drainage from eyes with a cool compress or damp napkin which ever you have available  If itching is bothering you you may use over-the-counter antihistamine such as Benadryl every 6 hours, be mindful this medication may make you drowsy, if needing to be more alert may use over-the-counter  Claritin, both of these medications may be found at the [facility]  If symptoms persist past 1 week of medication use, please follow-up with your eye doctor for evaluation   ED Prescriptions     Medication Sig Dispense Auth. Provider   trimethoprim-polymyxin b (POLYTRIM) ophthalmic solution Place 1 drop into  the left eye every 4 (four) hours. 10 mL Valinda Hoar, NP      PDMP not reviewed this encounter.   Valinda Hoar, [place] 07/31/21 [phone]

## 2021-07-31 NOTE — Discharge Instructions (Addendum)
Today you are being treated for bacterial conjunctivitis also known as pinkeye  When you arrive home remove contacts and dispose of them, wear glasses until symptoms resolve and treatment is complete  Place 1 drop into your left eye every 4 hours while awake for the next 7 days, if symptoms start to occur in your right eye you may use medication there as well, just ensure that the tip of the dropper does not touch your eyes  You can remove drainage from eyes with a cool compress or damp napkin which ever you have available  If itching is bothering you you may use over-the-counter antihistamine such as Benadryl every 6 hours, be mindful this medication may make you drowsy, if needing to be more alert may use over-the-counter Claritin, both of these medications may be found at the [facility]  If symptoms persist past 1 week of medication use, please follow-up with your eye doctor for evaluation

## 2021-09-22 ENCOUNTER — Encounter: Payer: Self-pay | Admitting: Emergency Medicine

## 2021-09-22 ENCOUNTER — Ambulatory Visit
Admission: EM | Admit: 2021-09-22 | Discharge: 2021-09-22 | Disposition: A | Discharge status: Home / Self Care | Payer: Medicaid Other

## 2021-09-22 DIAGNOSIS — Z3A01 Less than 8 weeks gestation of pregnancy: Secondary | ICD-10-CM | POA: Diagnosis not present

## 2021-09-22 DIAGNOSIS — O99891 Other specified diseases and conditions complicating pregnancy: Secondary | ICD-10-CM

## 2021-09-22 DIAGNOSIS — R55 Syncope and collapse: Secondary | ICD-10-CM | POA: Diagnosis not present

## 2021-09-22 NOTE — ED Provider Notes (Addendum)
[facility]    CSN: [id number] Arrival date & time: 09/22/21  1456      History   Chief Complaint Chief Complaint  Patient presents with   Loss of Consciousness    HPI Suzanne Elliott is a 25 y.o. female.   Patient here today for evaluation after syncopal episode at work earlier.  She states her boss called her as she was falling.  She has had some shortness of breath.  She notes she does have history of vertigo and wonders if this caused symptoms.  She has not seen OB for current pregnancy and has not had ultrasound at this time.  She does deny any abdominal pain.  The history is provided by the patient.  Loss of Consciousness Associated symptoms: shortness of breath   Associated symptoms: no fever    History reviewed. No pertinent past medical history.  There are no problems to display for this patient.   Past Surgical History:  Procedure Laterality Date   HERNIA REPAIR     in third grade    OB History     Gravida  1   Para      Term      Preterm      AB      Living         SAB      IAB      Ectopic      Multiple      Live Births               Home Medications    Prior to Admission medications   Medication Sig Start Date End Date Taking? Authorizing Provider  Cetirizine HCl 10 MG CAPS Take 1 capsule (10 mg total) by mouth daily for 10 days. 02/06/21 02/16/21  Wieters, Hallie C, PA-C  ibuprofen (ADVIL) 800 MG tablet Take 1 tablet (800 mg total) by mouth 3 (three) times daily. 02/06/21   Wieters, Hallie C, PA-C  silver sulfADIAZINE (SILVADENE) 1 % cream Apply 1 application topically daily. 04/18/21   Lamptey, Britta Mccreedy, MD  trimethoprim-polymyxin b (POLYTRIM) ophthalmic solution Place 1 drop into the left eye every 4 (four) hours. 07/31/21   White, Elita Boone, NP  dicyclomine (BENTYL) 20 MG tablet Take 1 tablet (20 mg total) by mouth 2 (two) times daily. 05/29/20 02/06/21  Belinda Fisher, PA-C    Family History Family History  Problem  Relation Age of Onset   Asthma Mother    Heart failure Father    Hypertension Father     Social History Social History   Tobacco Use   Smoking status: Never   Smokeless tobacco: Never  Substance Use Topics   Alcohol use: Not Currently   Drug use: Not Currently     Allergies   Patient has no known allergies.   Review of Systems Review of Systems  Constitutional:  Negative for chills and fever.  Respiratory:  Positive for shortness of breath.   Cardiovascular:  Positive for syncope.  Gastrointestinal:  Negative for abdominal pain.  Neurological:  Positive for syncope.    Physical Exam Triage Vital Signs ED Triage Vitals  Enc Vitals Group     BP 09/22/21 1512 (!) 144/82     Pulse Rate 09/22/21 1512 98     Resp 09/22/21 1512 16     Temp 09/22/21 1512 98.2 F (36.8 C)     Temp Source 09/22/21 1512 Oral     SpO2 09/22/21 1512 97 %  Weight --      Height --      Head Circumference --      Peak Flow --      Pain Score 09/22/21 1513 0     Pain Loc --      Pain Edu? --      Excl. in GC? --    Orthostatic VS for the past 24 hrs:  BP- Lying Pulse- Lying BP- Sitting Pulse- Sitting BP- Standing at 0 minutes Pulse- Standing at 0 minutes  09/22/21 1517 144/82 98 146/86 106 142/89 92    Updated Vital Signs BP (!) 144/82 (BP Location: Right Arm)   Pulse 98   Temp 98.2 F (36.8 C) (Oral)   Resp 16   LMP 07/30/2021   SpO2 97%   Physical Exam Vitals and nursing note reviewed.  Constitutional:      General: She is not in acute distress.    Appearance: Normal appearance. She is not ill-appearing.  HENT:     Head: Normocephalic and atraumatic.  Eyes:     Conjunctiva/sclera: Conjunctivae normal.  Cardiovascular:     Rate and Rhythm: Normal rate.  Pulmonary:     Effort: Pulmonary effort is normal.  Neurological:     Mental Status: She is alert.  Psychiatric:        Mood and Affect: Mood normal.        Behavior: Behavior normal.        Thought Content:  Thought content normal.     UC Treatments / Results  Labs (all labs ordered are listed, but only abnormal results are displayed) Labs Reviewed - No data to display  EKG   Radiology No results found.  Procedures Procedures (including critical care time)  Medications Ordered in UC Medications - No data to display  Initial Impression / Assessment and Plan / UC Course  I have reviewed the triage vital signs and the nursing notes.  Pertinent labs & imaging results that were available during my care of the patient were reviewed by me and considered in my medical decision making (see chart for details).  Patient appears stable at this time, but given unknown etiology of syncope recommended further evaluation in the ED.  I suspect she will need ultrasound etc. given new pregnancy.  Patient agrees with recommendation and will report to ED after leaving this office.  Final Clinical Impressions(s) / UC Diagnoses   Final diagnoses:  Less than [redacted] weeks gestation of pregnancy  Syncope, unspecified syncope type     Discharge Instructions      Please report to ED for further evaluation.      ED Prescriptions   None    PDMP not reviewed this encounter.   Tomi Bamberger, PA-C 09/22/21 1616    Tomi Bamberger, PA-C 09/22/21 [phone]

## 2021-09-22 NOTE — Discharge Instructions (Addendum)
  Please report to ED for further evaluation.  

## 2021-09-22 NOTE — ED Triage Notes (Addendum)
Patient was at work, went from a sitting to a standing position when she passed out, states boss was standing beside her and caught her mid fall. Found out she was pregnant two weeks prior, unsure of how far along she is, LMP 08/01/2021. Denies any pain. Reports shortness of breath.  Has eaten two meals today, denies blood sugar problems. G3 T1 P1 A0 L2

## 2021-09-28 ENCOUNTER — Inpatient Hospital Stay (HOSPITAL_COMMUNITY)
Admission: AD | Admit: 2021-09-28 | Discharge: 2021-09-28 | Disposition: A | Discharge status: Home / Self Care | Payer: BC Managed Care – PPO | Attending: Family Medicine | Admitting: Family Medicine

## 2021-09-28 ENCOUNTER — Other Ambulatory Visit: Payer: Self-pay

## 2021-09-28 ENCOUNTER — Inpatient Hospital Stay (HOSPITAL_COMMUNITY): Payer: BC Managed Care – PPO

## 2021-09-28 ENCOUNTER — Encounter (HOSPITAL_COMMUNITY): Payer: Self-pay | Admitting: Emergency Medicine

## 2021-09-28 DIAGNOSIS — O26899 Other specified pregnancy related conditions, unspecified trimester: Secondary | ICD-10-CM

## 2021-09-28 DIAGNOSIS — O99891 Other specified diseases and conditions complicating pregnancy: Secondary | ICD-10-CM

## 2021-09-28 DIAGNOSIS — R109 Unspecified abdominal pain: Secondary | ICD-10-CM | POA: Diagnosis not present

## 2021-09-28 DIAGNOSIS — Z3A08 8 weeks gestation of pregnancy: Secondary | ICD-10-CM

## 2021-09-28 DIAGNOSIS — O26891 Other specified pregnancy related conditions, first trimester: Secondary | ICD-10-CM | POA: Insufficient documentation

## 2021-09-28 DIAGNOSIS — Z3491 Encounter for supervision of normal pregnancy, unspecified, first trimester: Secondary | ICD-10-CM

## 2021-09-28 DIAGNOSIS — N7093 Salpingitis and oophoritis, unspecified: Secondary | ICD-10-CM | POA: Diagnosis not present

## 2021-09-28 DIAGNOSIS — N7011 Chronic salpingitis: Secondary | ICD-10-CM

## 2021-09-28 LAB — URINALYSIS, ROUTINE W REFLEX MICROSCOPIC
Bilirubin Urine: NEGATIVE
Glucose, UA: NEGATIVE mg/dL
Ketones, ur: NEGATIVE mg/dL
Leukocytes,Ua: NEGATIVE
Nitrite: NEGATIVE
Protein, ur: NEGATIVE mg/dL
Specific Gravity, Urine: 1.019 (ref 1.005–1.030)
pH: 6 (ref 5.0–8.0)

## 2021-09-28 LAB — WET PREP, GENITAL
Clue Cells Wet Prep HPF POC: NONE SEEN
Sperm: NONE SEEN
Trich, Wet Prep: NONE SEEN
Yeast Wet Prep HPF POC: NONE SEEN

## 2021-09-28 LAB — CBC
HCT: 39.3 % (ref 36.0–46.0)
Hemoglobin: 13.3 g/dL (ref 12.0–15.0)
MCH: 28.1 pg (ref 26.0–34.0)
MCHC: 33.8 g/dL (ref 30.0–36.0)
MCV: 82.9 fL (ref 80.0–100.0)
Platelets: 334 10*3/uL (ref 150–400)
RBC: 4.74 MIL/uL (ref 3.87–5.11)
RDW: 15.4 % (ref 11.5–15.5)
WBC: 11.1 10*3/uL — ABNORMAL HIGH (ref 4.0–10.5)
nRBC: 0 % (ref 0.0–0.2)

## 2021-09-28 LAB — POC URINE PREG, ED: Preg Test, Ur: POSITIVE — AB

## 2021-09-28 LAB — HCG, QUANTITATIVE, PREGNANCY: hCG, Beta Chain, Quant, S: 144242 m[IU]/mL — ABNORMAL HIGH (ref <5)

## 2021-09-28 LAB — ABO/RH: ABO/RH(D): O POS

## 2021-09-28 MED ORDER — PROMETHAZINE HCL 25 MG PO TABS: 25.0000 mg | ORAL_TABLET | Freq: Four times a day (QID) | ORAL | 0 refills | Status: DC | PRN

## 2021-09-28 MED ORDER — ONDANSETRON 8 MG PO TBDP: 8.0000 mg | ORAL_TABLET | Freq: Three times a day (TID) | ORAL | 0 refills | Status: DC | PRN

## 2021-09-28 NOTE — ED Provider Notes (Signed)
Emergency Medicine Provider Triage Evaluation Note  Suzanne Elliott , a 25 y.o. female  was evaluated in triage.  Pt complains of fall.  Started having abdominal pain, felt lightheaded but did not have a syncopal event or pass out.  She fell forward.  Reports she was having pain in her upper abdomen right before she fell.  She does have a history of vertigo.  She had her last period in August, states she is pregnant took an at home pregnancy test which was positive.  No confirmed test in the system.  Review of Systems  Positive: Abdominal pain, nausea, fall, lightheadedness Negative: Vaginal bleeding, syncope  Physical Exam  LMP 07/30/2021  Gen:   Awake, no distress   Resp:  Normal effort  MSK:   Moves extremities without difficulty  Other:  Upper abdominal tenderness, no nystagmus  Medical Decision Making  Medically screening exam initiated at 8:39 AM.  Appropriate orders placed.  Judieth Lada was informed that the remainder of the evaluation will be completed by another provider, this initial triage assessment does not replace that evaluation, and the importance of remaining in the ED until their evaluation is complete.  Will confirm patient pregnancy.  After that we will call MAU APP.   Theron Arista, PA-C 09/28/21 [id number]    Benjiman Core, MD 09/28/21 2115

## 2021-09-28 NOTE — Discharge Instructions (Signed)
Safe Medications in Pregnancy    Acne: Benzoyl Peroxide Salicylic Acid  Backache/Headache: Tylenol: 2 regular strength every 4 hours OR              2 Extra strength every 6 hours  Colds/Coughs/Allergies: Benadryl (alcohol free) 25 mg every 6 hours as needed Breath right strips Claritin Cepacol throat lozenges Chloraseptic throat spray Cold-Eeze- up to three times per day Cough drops, alcohol free Flonase (by prescription only) Guaifenesin Mucinex Robitussin DM (plain only, alcohol free) Saline nasal spray/drops Sudafed (pseudoephedrine) & Actifed ** use only after [redacted] weeks gestation and if you do not have high blood pressure Tylenol Vicks Vaporub Zinc lozenges Zyrtec   Constipation: Colace Ducolax suppositories Fleet enema Glycerin suppositories Metamucil Milk of magnesia Miralax Senokot Smooth move tea  Diarrhea: Kaopectate Imodium A-D  *NO pepto Bismol  Hemorrhoids: Anusol Anusol HC Preparation H Tucks  Indigestion: Tums Maalox Mylanta Zantac  Pepcid  Insomnia: Benadryl (alcohol free) 25mg every 6 hours as needed Tylenol PM Unisom, no Gelcaps  Leg Cramps: Tums MagGel  Nausea/Vomiting:  Bonine Dramamine Emetrol Ginger extract Sea bands Meclizine  Nausea medication to take during pregnancy:  Unisom (doxylamine succinate 25 mg tablets) Take one tablet daily at bedtime. If symptoms are not adequately controlled, the dose can be increased to a maximum recommended dose of two tablets daily (1/2 tablet in the morning, 1/2 tablet mid-afternoon and one at bedtime). Vitamin B6 100mg tablets. Take one tablet twice a day (up to 200 mg per day).  Skin Rashes: Aveeno products Benadryl cream or 25mg every 6 hours as needed Calamine Lotion 1% cortisone cream  Yeast infection: Gyne-lotrimin 7 Monistat 7   **If taking multiple medications, please check labels to avoid duplicating the same active ingredients **take  medication as directed on the label ** Do not exceed 4000 mg of tylenol in 24 hours **Do not take medications that contain aspirin or ibuprofen   Prenatal Care Providers           [facility]  [address] [phone]  [facility]   [address]  [phone]  [facility] [place]       [address] [phone]            [facility] @ [place]     [address] [phone]          [facility] @ [place]   [address] [phone]  [facility]  [address] [phone]     [facility] @ [facility])  [address]   [phone]     [facility]  Phone: [phone]  [facility]  Phone: [phone]  [facility] Phone: [phone]  [facility] Phone: [phone]  [facility] Phone: [phone]  [facility] Phone: [phone]  [facility]  Phone: [phone]  

## 2021-09-28 NOTE — ED Notes (Signed)
Report given to [facility] in MAU

## 2021-09-28 NOTE — ED Triage Notes (Addendum)
Pt reports history of vertigo.  States she is pregnant but unsure EDD.  LMP 08/01/21.  Reports upper abd pain that started Wednesday.  She got dizzy and fell going up steps on Friday and abd pain worsened on Saturday.    Denies vaginal bleeding or discharge.

## 2021-09-28 NOTE — MAU Note (Signed)
Pt reports to mau with c/o upper abd pain after falling 2 days ago.  Pt denies bleeding.  Reports pain is sharp.

## 2021-09-28 NOTE — MAU Provider Note (Signed)
History     CSN: [id number]  Arrival date and time: 09/28/21 8242   None     Chief Complaint  Patient presents with   Fall   Abdominal Pain   HPI Suzanne Elliott is a 25 y.o. G1P0 at [redacted]w[redacted]d who presents with abdominal pain. She states she had a syncopal episode and fell 2 days ago. Since then she has had generalized abdominal pain that is sharp. She rates the pain a 4/10 and has not tried anything for the pain. She denies any bleeding or abnormal discharge.   OB History     Gravida  1   Para      Term      Preterm      AB      Living         SAB      IAB      Ectopic      Multiple      Live Births              History reviewed. No pertinent past medical history.  Past Surgical History:  Procedure Laterality Date   HERNIA REPAIR     in third grade    Family History  Problem Relation Age of Onset   Asthma Mother    Heart failure Father    Hypertension Father     Social History   Tobacco Use   Smoking status: Never   Smokeless tobacco: Never  Substance Use Topics   Alcohol use: Not Currently   Drug use: Not Currently    Allergies: No Known Allergies  Medications Prior to Admission  Medication Sig Dispense Refill Last Dose   Cetirizine HCl 10 MG CAPS Take 1 capsule (10 mg total) by mouth daily for 10 days. 10 capsule 0    ibuprofen (ADVIL) 800 MG tablet Take 1 tablet (800 mg total) by mouth 3 (three) times daily. 21 tablet 0    silver sulfADIAZINE (SILVADENE) 1 % cream Apply 1 application topically daily. 50 g 0    trimethoprim-polymyxin b (POLYTRIM) ophthalmic solution Place 1 drop into the left eye every 4 (four) hours. 10 mL 0     Review of Systems Physical Exam   Blood pressure 130/73, pulse 92, temperature 97.9 F (36.6 C), temperature source Oral, resp. rate 18, last menstrual period 08/01/2021, SpO2 100 %.  Physical Exam  MAU Course  Procedures Results for orders placed or performed during the hospital encounter of  09/28/21 (from the past 24 hour(s))  POC Urine Pregnancy, ED (not at [facility])     Status: Abnormal   Collection Time: 09/28/21  8:56 AM  Result Value Ref Range   Preg Test, Ur POSITIVE (A) NEGATIVE  Urinalysis, Routine w reflex microscopic Urine, Clean Catch     Status: Abnormal   Collection Time: 09/28/21  9:41 AM  Result Value Ref Range   Color, Urine YELLOW YELLOW   APPearance CLOUDY (A) CLEAR   Specific Gravity, Urine 1.019 1.005 - 1.030   pH 6.0 5.0 - 8.0   Glucose, UA NEGATIVE NEGATIVE mg/dL   Hgb urine dipstick SMALL (A) NEGATIVE   Bilirubin Urine NEGATIVE NEGATIVE   Ketones, ur NEGATIVE NEGATIVE mg/dL   Protein, ur NEGATIVE NEGATIVE mg/dL   Nitrite NEGATIVE NEGATIVE   Leukocytes,Ua NEGATIVE NEGATIVE   RBC / HPF 0-5 0 - 5 RBC/hpf   WBC, UA 0-5 0 - 5 WBC/hpf   Bacteria, UA RARE (A) NONE SEEN   Squamous Epithelial / LPF  0-5 0 - 5   Mucus PRESENT    Ca Oxalate Crys, UA PRESENT   CBC     Status: Abnormal   Collection Time: 09/28/21  9:49 AM  Result Value Ref Range   WBC 11.1 (H) 4.0 - 10.5 K/uL   RBC 4.74 3.87 - 5.11 MIL/uL   Hemoglobin 13.3 12.0 - 15.0 g/dL   HCT 16.1 09.6 - 04.5 %   MCV 82.9 80.0 - 100.0 fL   MCH 28.1 26.0 - 34.0 pg   MCHC 33.8 30.0 - 36.0 g/dL   RDW 40.9 81.1 - 91.4 %   Platelets 334 150 - 400 K/uL   nRBC 0.0 0.0 - 0.2 %  hCG, quantitative, pregnancy     Status: Abnormal   Collection Time: 09/28/21  9:49 AM  Result Value Ref Range   hCG, Beta Chain, Quant, S 144,242 (H) <5 mIU/mL  ABO/Rh     Status: None   Collection Time: 09/28/21  9:49 AM  Result Value Ref Range   ABO/RH(D) O POS    No rh immune globuloin      NOT A RH IMMUNE GLOBULIN CANDIDATE, PT RH POSITIVE Performed at [facility], [address] [place], [place] [address]   Wet prep, genital     Status: Abnormal   Collection Time: 09/28/21 11:10 AM   Specimen: Vaginal  Result Value Ref Range   Yeast Wet Prep HPF POC NONE SEEN NONE SEEN   Trich, Wet Prep NONE SEEN NONE SEEN    Clue Cells Wet Prep HPF POC NONE SEEN NONE SEEN   WBC, Wet Prep HPF POC MANY (A) NONE SEEN   Sperm NONE SEEN     US OB LESS THAN 14 WEEKS WITH OB TRANSVAGINAL  Result Date: 09/28/2021 CLINICAL DATA:  Abdominal pain EXAM: OBSTETRIC <14 WK ULTRASOUND TECHNIQUE: Transabdominal ultrasound was performed for evaluation of the gestation as well as the maternal uterus and adnexal regions. COMPARISON:  None. FINDINGS: Intrauterine gestational sac: Single Yolk sac:  Visualized. Embryo:  Visualized. Cardiac Activity: Visualized. Heart Rate: 176 bpm CRL:   17.7 mm   8 w 1 d                  [place] EDC: 05/09/2022 Subchorionic hemorrhage:  None visualized. Maternal uterus/adnexae: Right ovary measures 6.1 x 3.6 x 5.1 cm. Left ovary measures 4.2 x 2.1 x 3.4 cm. Mildly dilated fluid-filled tubular serpiginous structure in the left adnexal region, likely reflecting hydrosalpinx. No free fluid within the pelvis. IMPRESSION: 1. Single live intrauterine gestation measuring 8 weeks 1 day by crown-rump length. 2. Active embryonic heart tones at 176 BPM. 3. 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 Electronically Signed   By: Duanne Guess D.O.   On: 09/28/2021 12:45     MDM UA, UPT CBC, HCG ABO/Rh- O Pos Wet prep and gc/chlamydia US OB Comp Less 14 weeks with Transvaginal  Reviewed result of hydrosalpinx with Dr. Shawnie Pons- no intervention needed at this time  Assessment and Plan   1. Normal intrauterine pregnancy on prenatal ultrasound in first trimester   2. Abdominal pain affecting pregnancy   3. [redacted] weeks gestation of pregnancy   4. Hydrosalpinx    -Discharge home in stable condition -Rx for zofran and phenergan sent to patient's pharmacy per her request -First trimester precautions discussed -Patient advised to follow-up with OB to establish prenatal care -Patient may return to MAU as needed or if her condition were to change or worsen  Rolm Bookbinder CNM 09/28/2021, 12:26 PM

## 2021-09-29 LAB — GC/CHLAMYDIA PROBE AMP (~~LOC~~) NOT AT ARMC
Chlamydia: NEGATIVE
Comment: NEGATIVE
Comment: NORMAL
Neisseria Gonorrhea: NEGATIVE

## 2021-10-16 ENCOUNTER — Telehealth (INDEPENDENT_AMBULATORY_CARE_PROVIDER_SITE_OTHER): Payer: BC Managed Care – PPO

## 2021-10-16 DIAGNOSIS — Z3490 Encounter for supervision of normal pregnancy, unspecified, unspecified trimester: Secondary | ICD-10-CM

## 2021-10-16 NOTE — Progress Notes (Signed)
Patient did not keep appt. Called pt at 0915; VM left stating patient may join MyChart video for virtual appt. Direct link to video texted to patient. Called pt at 0930; VM left stating pt may reschedule appt by calling the office or responding to MyChart message.

## 2021-10-24 DIAGNOSIS — Z3401 Encounter for supervision of normal first pregnancy, first trimester: Secondary | ICD-10-CM | POA: Insufficient documentation

## 2021-10-27 ENCOUNTER — Encounter: Payer: Self-pay | Admitting: Obstetrics and Gynecology

## 2021-10-27 ENCOUNTER — Other Ambulatory Visit: Payer: Self-pay

## 2021-10-27 ENCOUNTER — Other Ambulatory Visit (HOSPITAL_COMMUNITY)
Admission: RE | Admit: 2021-10-27 | Discharge: 2021-10-27 | Disposition: A | Discharge status: Home / Self Care | Payer: BC Managed Care – PPO | Source: Ambulatory Visit | Attending: Obstetrics and Gynecology | Admitting: Obstetrics and Gynecology

## 2021-10-27 ENCOUNTER — Ambulatory Visit (INDEPENDENT_AMBULATORY_CARE_PROVIDER_SITE_OTHER): Payer: BC Managed Care – PPO | Admitting: Obstetrics and Gynecology

## 2021-10-27 VITALS — BP 138/90 | HR 103 | Wt 235.3 lb

## 2021-10-27 DIAGNOSIS — Z3401 Encounter for supervision of normal first pregnancy, first trimester: Secondary | ICD-10-CM

## 2021-10-27 DIAGNOSIS — O2341 Unspecified infection of urinary tract in pregnancy, first trimester: Secondary | ICD-10-CM

## 2021-10-27 DIAGNOSIS — O10019 Pre-existing essential hypertension complicating pregnancy, unspecified trimester: Secondary | ICD-10-CM | POA: Diagnosis not present

## 2021-10-27 DIAGNOSIS — O99341 Other mental disorders complicating pregnancy, first trimester: Secondary | ICD-10-CM | POA: Diagnosis not present

## 2021-10-27 DIAGNOSIS — F419 Anxiety disorder, unspecified: Secondary | ICD-10-CM | POA: Diagnosis not present

## 2021-10-27 DIAGNOSIS — J45909 Unspecified asthma, uncomplicated: Secondary | ICD-10-CM | POA: Diagnosis not present

## 2021-10-27 DIAGNOSIS — O09299 Supervision of pregnancy with other poor reproductive or obstetric history, unspecified trimester: Secondary | ICD-10-CM | POA: Insufficient documentation

## 2021-10-27 DIAGNOSIS — N7011 Chronic salpingitis: Secondary | ICD-10-CM

## 2021-10-27 DIAGNOSIS — O99512 Diseases of the respiratory system complicating pregnancy, second trimester: Secondary | ICD-10-CM | POA: Diagnosis not present

## 2021-10-27 DIAGNOSIS — I10 Essential (primary) hypertension: Secondary | ICD-10-CM | POA: Insufficient documentation

## 2021-10-27 MED ORDER — PRENATAL 27-1 MG PO TABS: 1.0000 | ORAL_TABLET | Freq: Every day | ORAL | 12 refills | Status: DC

## 2021-10-27 MED ORDER — ASPIRIN EC 81 MG PO TBEC: 81.0000 mg | DELAYED_RELEASE_TABLET | Freq: Every day | ORAL | 11 refills | Status: DC

## 2021-10-27 NOTE — Progress Notes (Signed)
Hx of preeclampsia in pregnancy in 2016 (preterm delivery at 35w) Hx of chronic HTN, currently seen by PCP at [facility]. No medication prescribed since 2016. Reports hx of low fluid in last pregnancy in 2017  [place], [place]

## 2021-10-27 NOTE — Progress Notes (Signed)
History:   Suzanne Elliott is a 25 y.o. [id number] at [redacted]w[redacted]d by L=8 being seen today for her first obstetrical visit.  Her obstetrical history is significant for obesity, pre-eclampsia, and CHTN, oligohydramnios . Patient does intend to breast feed. Pregnancy history fully reviewed.  Patient reports no complaints.      HISTORY: OB History  Gravida Para Term Preterm AB Living  4 2 1 1 1 2   SAB IAB Ectopic Multiple Live Births  0 1 0 0 2    # Outcome Date GA Lbr Len/2nd Weight Sex Delivery Anes PTL Lv  4 Current           3 IAB 02/12/19          2 Term 07/07/18 [redacted]w[redacted]d  7 lb 6 oz (3.345 kg) M Vag-Spont  N LIV  1 Preterm 03/31/15 [redacted]w[redacted]d  4 lb 3 oz (1.899 kg) F Vag-Spont  N LIV    Last pap smear was done one year ago and was  HPV pos.   Past Medical History:  Diagnosis Date   Pregnancy induced hypertension    Preterm labor    Past Surgical History:  Procedure Laterality Date   HERNIA REPAIR     in third grade   Family History  Problem Relation Age of Onset   Asthma Mother    Heart failure Father    Hypertension Father    Social History   Tobacco Use   Smoking status: Never   Smokeless tobacco: Never  Substance Use Topics   Alcohol use: Not Currently   Drug use: Never   No Known Allergies Current Outpatient Medications on File Prior to Visit  Medication Sig Dispense Refill   ondansetron (ZOFRAN ODT) 8 MG disintegrating tablet Take 1 tablet (8 mg total) by mouth every 8 (eight) hours as needed for nausea or vomiting. 30 tablet 0   Prenatal Vit-Fe Fumarate-FA (MULTIVITAMIN-PRENATAL) 27-0.8 MG TABS tablet Take 1 tablet by mouth daily at 12 noon.     promethazine (PHENERGAN) 25 MG tablet Take 1 tablet (25 mg total) by mouth every 6 (six) hours as needed for nausea or vomiting. 30 tablet 0   [DISCONTINUED] dicyclomine (BENTYL) 20 MG tablet Take 1 tablet (20 mg total) by mouth 2 (two) times daily. 20 tablet 0   No current facility-administered medications on file prior to  visit.    Review of Systems Pertinent items noted in HPI and remainder of comprehensive ROS otherwise negative.  Physical Exam:   Vitals:   10/27/21 1354  BP: 138/90  Pulse: (!) 103  Weight: 235 lb 4.8 oz (106.7 kg)   Fetal Heart Rate (bpm): 150  General: well-developed, well-nourished female in no acute distress  Breasts:  normal appearance, no masses or tenderness bilaterally  Skin: normal coloration and turgor, no rashes  Neurologic: oriented, normal, negative, normal mood  Extremities: normal strength, tone, and muscle mass, ROM of all joints is normal  HEENT PERRLA, extraocular movement intact and sclera clear, anicteric  Neck supple and no masses  Cardiovascular: regular rate and rhythm  Respiratory:  no respiratory distress, normal breath sounds  Abdomen: soft, non-tender; bowel sounds normal; no masses,  no organomegaly  Pelvic: normal external genitalia, no lesions, normal vaginal mucosa, normal vaginal discharge, normal cervix, pap smear done. Uterine size:      Assessment:    Pregnancy: 10/29/21 Patient Active Problem List   Diagnosis Date Noted   Hx of preeclampsia, prior pregnancy, currently pregnant, unspecified trimester 10/27/2021  Postpartum hemorrhage 10/27/2021   Chronic hypertension 10/27/2021   Encounter for supervision of normal first pregnancy in first trimester 10/24/2021   Hydrosalpinx 09/28/2021     Plan:    1. Encounter for supervision of normal first pregnancy in first trimester - Flu shot next time  - Prenatal 27-1 MG TABS; Take 1 tablet by mouth daily.  Dispense: 30 tablet; Refill: 12 - Genetic Screening - US MFM OB COMP + 14 WK; Future - Hemoglobin A1c - CHL AMB BABYSCRIPTS SCHEDULE OPTIMIZATION - Culture, OB Urine - CBC/D/Plt+RPR+Rh+ABO+RubIgG... - Cytology - PAP( [facility]) - Cervicovaginal ancillary only( [place])  2. Hydrosalpinx  3. Hx of preeclampsia, prior pregnancy, currently pregnant, unspecified trimester - Start  ldASA  4. Postpartum hemorrhage, unspecified type - Discussed risk of recurrence  5. Chronic hypertension - No meds at this time - She will do babyscripts and has home bp cuff - Discussed serial growth scans at 28w - Baseline labs ordered   Initial labs drawn. Continue prenatal vitamins. Problem list reviewed and updated. Genetic Screening discussed, First trimester screen, Quad screen, and NIPS: ordered. Ultrasound discussed; fetal anatomic survey: ordered. Anticipatory guidance about prenatal visits given including labs, ultrasounds, and testing. Discussed usage of Babyscripts and virtual visits as additional source of managing and completing prenatal visits in midst of coronavirus and pandemic.   Encouraged to complete MyChart Registration for her ability to review results, send requests, and have questions addressed.  The nature of [facility] with multiple MDs and Advanced Practice Providers was explained to patient; also emphasized that residents, students are part of our team. Routine obstetric precautions reviewed. Encouraged to seek out care at office or emergency room [facility] MAU preferred) for urgent and/or emergent concerns. Return in about 4 weeks (around 11/24/2021) for OB VISIT, MD or APP.    Milas Hock, MD, FACOG Obstetrician & Gynecologist, [facility] for [facility], [facility]

## 2021-10-28 LAB — CBC/D/PLT+RPR+RH+ABO+RUBIGG...
Antibody Screen: NEGATIVE
Basophils Absolute: 0.1 10*3/uL (ref 0.0–0.2)
Basos: 1 %
EOS (ABSOLUTE): 0.1 10*3/uL (ref 0.0–0.4)
Eos: 1 %
HCV Ab: 0.1 s/co ratio (ref 0.0–0.9)
HIV Screen 4th Generation wRfx: NONREACTIVE
Hematocrit: 39.4 % (ref 34.0–46.6)
Hemoglobin: 13.1 g/dL (ref 11.1–15.9)
Hepatitis B Surface Ag: NEGATIVE
Immature Grans (Abs): 0.1 10*3/uL (ref 0.0–0.1)
Immature Granulocytes: 1 %
Lymphocytes Absolute: 3.1 10*3/uL (ref 0.7–3.1)
Lymphs: 24 %
MCH: 28.1 pg (ref 26.6–33.0)
MCHC: 33.2 g/dL (ref 31.5–35.7)
MCV: 84 fL (ref 79–97)
Monocytes Absolute: 0.8 10*3/uL (ref 0.1–0.9)
Monocytes: 7 %
Neutrophils Absolute: 8.5 10*3/uL — ABNORMAL HIGH (ref 1.4–7.0)
Neutrophils: 66 %
Platelets: 292 10*3/uL (ref 150–450)
RBC: 4.67 x10E6/uL (ref 3.77–5.28)
RDW: 15.2 % (ref 11.7–15.4)
RPR Ser Ql: NONREACTIVE
Rh Factor: POSITIVE
Rubella Antibodies, IGG: 1.66 index (ref >0.99)
WBC: 12.7 10*3/uL — ABNORMAL HIGH (ref 3.4–10.8)

## 2021-10-28 LAB — COMPREHENSIVE METABOLIC PANEL
ALT: 7 IU/L (ref 0–32)
AST: 14 IU/L (ref 0–40)
Albumin/Globulin Ratio: 1.5 (ref 1.2–2.2)
Albumin: 4.2 g/dL (ref 3.9–5.0)
Alkaline Phosphatase: 80 IU/L (ref 44–121)
BUN/Creatinine Ratio: 10 (ref 9–23)
BUN: 6 mg/dL (ref 6–20)
Bilirubin Total: <0.2 mg/dL (ref 0.0–1.2)
CO2: 17 mmol/L — ABNORMAL LOW (ref 20–29)
Calcium: 9.4 mg/dL (ref 8.7–10.2)
Chloride: 103 mmol/L (ref 96–106)
Creatinine, Ser: 0.61 mg/dL (ref 0.57–1.00)
Globulin, Total: 2.8 g/dL (ref 1.5–4.5)
Glucose: 76 mg/dL (ref 70–99)
Potassium: 4 mmol/L (ref 3.5–5.2)
Sodium: 136 mmol/L (ref 134–144)
Total Protein: 7 g/dL (ref 6.0–8.5)
eGFR: 128 mL/min/{1.73_m2} (ref >59)

## 2021-10-28 LAB — PROTEIN / CREATININE RATIO, URINE
Creatinine, Urine: 166.1 mg/dL
Protein, Ur: 14.2 mg/dL
Protein/Creat Ratio: 85 mg/g creat (ref 0–200)

## 2021-10-28 LAB — HCV INTERPRETATION

## 2021-10-28 LAB — HEMOGLOBIN A1C
Est. average glucose Bld gHb Est-mCnc: 114 mg/dL
Hgb A1c MFr Bld: 5.6 % (ref 4.8–5.6)

## 2021-10-31 DIAGNOSIS — O2341 Unspecified infection of urinary tract in pregnancy, first trimester: Secondary | ICD-10-CM | POA: Insufficient documentation

## 2021-10-31 LAB — URINE CULTURE, OB REFLEX

## 2021-10-31 LAB — CULTURE, OB URINE

## 2021-10-31 MED ORDER — NITROFURANTOIN MONOHYD MACRO 100 MG PO CAPS: 100.0000 mg | ORAL_CAPSULE | Freq: Two times a day (BID) | ORAL | 1 refills | Status: DC

## 2021-10-31 NOTE — Addendum Note (Signed)
Addended by: Milas Hock A on: 10/31/2021 09:17 PM   Modules accepted: Orders

## 2021-11-03 ENCOUNTER — Ambulatory Visit: Payer: Self-pay | Admitting: *Deleted

## 2021-11-03 DIAGNOSIS — U071 COVID-19: Secondary | ICD-10-CM

## 2021-11-03 HISTORY — DX: COVID-19: U07.1

## 2021-11-03 LAB — CYTOLOGY - PAP
Chlamydia: NEGATIVE
Comment: NEGATIVE
Comment: NORMAL
Diagnosis: NEGATIVE
Neisseria Gonorrhea: NEGATIVE

## 2021-11-03 NOTE — Telephone Encounter (Signed)
C/o testing positive for covid with at home test today. Reports symptoms noted for a couple of weeks with what she thought was sinus infection. Patient's children also dx with covid. C/o shortness of breath with exertion, nasal drainage, congestion , blowing nose green mucus with red specks in it and nose bleeds reported. Denies fever, chest pain . Dry cough reported since Saturday. Patient is [redacted] weeks pregnant. Reviewed isolation precautions with patient. Recommended patient contact OBGYN and go to UC or ED for evaluation or go on My Chart for E visit  due to pregnancy. Care advise given. Patient verbalized understanding of care advise and to get E visit via My Chart or  go to UC or ED if symptoms worsen.

## 2021-11-03 NOTE — Telephone Encounter (Signed)
Reason for Disposition  [1] HIGH RISK for severe COVID complications (e.g., weak immune system, age > 64 years, obesity with BMI > 25, pregnant, chronic lung disease or other chronic medical condition) AND [2] COVID symptoms (e.g., cough, fever)  (Exceptions: Already seen by PCP and no new or worsening symptoms.)  Answer Assessment - Initial Assessment Questions 1. COVID-19 DIAGNOSIS: "Who made your COVID-19 diagnosis?" "Was it confirmed by a positive lab test or self-test?" If not diagnosed by a doctor (or NP/PA), ask "Are there lots of cases (community spread) where you live?" Note: See public health department website, if unsure.     At home covid test positive  2. COVID-19 EXPOSURE: "Was there any known exposure to COVID before the symptoms began?" CDC Definition of close contact: within 6 feet (2 meters) for a total of 15 minutes or more over a 24-hour period.      Yes . Patient's family is positive too  3. ONSET: "When did the COVID-19 symptoms start?"      Couple of weeks ago  4. WORST SYMPTOM: "What is your worst symptom?" (e.g., cough, fever, shortness of breath, muscle aches)     Na  5. COUGH: "Do you have a cough?" If Yes, ask: "How bad is the cough?"       Dry cough  6. FEVER: "Do you have a fever?" If Yes, ask: "What is your temperature, how was it measured, and when did it start?"     No  7. RESPIRATORY STATUS: "Describe your breathing?" (e.g., shortness of breath, wheezing, unable to speak)      Shortness of breath with exertion  8. BETTER-SAME-WORSE: "Are you getting better, staying the same or getting worse compared to yesterday?"  If getting worse, ask, "In what way?"     na 9. HIGH RISK DISEASE: "Do you have any chronic medical problems?" (e.g., asthma, heart or lung disease, weak immune system, obesity, etc.)     [redacted] weeks pregnant  10. VACCINE: "Have you had the COVID-19 vaccine?" If Yes, ask: "Which one, how many shots, when did you get it?"       X 2 Pfizer  11. BOOSTER:  "Have you received your COVID-19 booster?" If Yes, ask: "Which one and when did you get it?"       no 12. PREGNANCY: "Is there any chance you are pregnant?" "When was your last menstrual period?"       13 weeks  13. OTHER SYMPTOMS: "Do you have any other symptoms?"  (e.g., chills, fatigue, headache, loss of smell or taste, muscle pain, sore throat)       *No Answer* 14. O2 SATURATION MONITOR:  "Do you use an oxygen saturation monitor (pulse oximeter) at home?" If Yes, ask "What is your reading (oxygen level) today?" "What is your usual oxygen saturation reading?" (e.g., 95%)       na  Protocols used: Coronavirus (COVID-19) Diagnosed or Suspected-A-AH

## 2021-11-04 ENCOUNTER — Other Ambulatory Visit: Payer: Self-pay

## 2021-11-04 ENCOUNTER — Inpatient Hospital Stay (HOSPITAL_COMMUNITY)
Admission: EM | Admit: 2021-11-04 | Discharge: 2021-11-04 | Disposition: A | Discharge status: Home / Self Care | Payer: BC Managed Care – PPO | Attending: Obstetrics & Gynecology | Admitting: Obstetrics & Gynecology

## 2021-11-04 ENCOUNTER — Encounter (HOSPITAL_COMMUNITY): Payer: Self-pay

## 2021-11-04 DIAGNOSIS — U071 COVID-19: Secondary | ICD-10-CM | POA: Insufficient documentation

## 2021-11-04 DIAGNOSIS — Z3A13 13 weeks gestation of pregnancy: Secondary | ICD-10-CM | POA: Insufficient documentation

## 2021-11-04 DIAGNOSIS — O99511 Diseases of the respiratory system complicating pregnancy, first trimester: Secondary | ICD-10-CM | POA: Diagnosis not present

## 2021-11-04 DIAGNOSIS — O98511 Other viral diseases complicating pregnancy, first trimester: Secondary | ICD-10-CM | POA: Diagnosis present

## 2021-11-04 MED ORDER — ACETAMINOPHEN 325 MG PO TABS
650.0000 mg | ORAL_TABLET | Freq: Once | ORAL | Status: AC
  Administered 2021-11-04: 650 mg via ORAL
  Filled 2021-11-04: qty 2

## 2021-11-04 NOTE — ED Triage Notes (Signed)
Pt. Stated, I have chest pain when I cough

## 2021-11-04 NOTE — ED Provider Notes (Signed)
Emergency Medicine Provider Triage Evaluation Note  Suzanne Elliott , a 25 y.o. female  was evaluated in triage.  Pt complains of body aches, fevers. Test positive for covid at home  Review of Systems  Positive: Body aches, fevers, cough Negative: Nausea and vomiting  Physical Exam  BP 134/86 (BP Location: Right Arm)   Pulse (!) 121   Temp 100.3 F (37.9 C) (Oral)   Resp 16   LMP 08/01/2021   SpO2 98%  Gen:   Awake, no distress   Resp:  Normal effort  MSK:   Moves extremities without difficulty  Other:  Tearful, tachycardic  Medical Decision Making  Medically screening exam initiated at 9:25 AM.  Appropriate orders placed.  Suzanne Elliott was informed that the remainder of the evaluation will be completed by another provider, this initial triage assessment does not replace that evaluation, and the importance of remaining in the ED until their evaluation is complete.  9:26 PM MAU did not accept patient   Suzanne Elliott 11/04/21 8412    Suzanne Grizzle, MD 11/04/21 1314

## 2021-11-04 NOTE — ED Triage Notes (Signed)
EMS stated, she is [redacted] weeks pregnant with Positive COVID  , she has hypertension , Complains of chills and fatigue, bodyaches.

## 2021-11-04 NOTE — Discharge Instructions (Signed)

## 2021-11-04 NOTE — MAU Note (Signed)
+  home Covid test last night.  Came in because her body is hurting really bad and she has a headache. Was given Tylenol in the ER, little help. Fever of 103 in the ER.

## 2021-11-04 NOTE — MAU Provider Note (Signed)
Event Date/Time   First Provider Initiated Contact with Patient 11/04/21 1544     S Suzanne Elliott is a 25 y.o. [phone] pregnant female at [redacted]w[redacted]d who presents to MAU today with complaint of positive home Covid test, fever and body aches. She has no abdominal pain or cramping, vaginal discharge or bleeding. Is just concerned about the baby and is not sure what she can take to feel better. No nausea and has been able to eat/drink normally.  Receives care at [facility], prenatal records reviewed.   Pertinent items noted in HPI and remainder of comprehensive ROS otherwise negative.   O BP 135/88 (BP Location: Right Arm)   Pulse (!) 104   Temp (!) 102.5 F (39.2 C) (Oral)   Resp 20   Ht 5\' 3"  (1.6 m)   Wt 235 lb (106.6 kg)   LMP 08/01/2021   SpO2 100%   BMI 41.63 kg/m  Physical Exam Vitals and nursing note reviewed.  Constitutional:      General: She is not in acute distress.    Appearance: She is ill-appearing.  HENT:     Head: Normocephalic.     Nose: Nose normal. No congestion.     Mouth/Throat:     Mouth: Mucous membranes are dry.  Eyes:     Pupils: Pupils are equal, round, and reactive to light.  Cardiovascular:     Rate and Rhythm: Regular rhythm. Tachycardia present.     Pulses: Normal pulses.  Pulmonary:     Effort: Pulmonary effort is normal.  Abdominal:     Palpations: Abdomen is soft.     Tenderness: There is no abdominal tenderness.  Musculoskeletal:        General: Normal range of motion.     Cervical back: Normal range of motion.  Skin:    General: Skin is warm.     Capillary Refill: Capillary refill takes less than 2 seconds.  Neurological:     Mental Status: She is alert and oriented to person, place, and time.  Psychiatric:        Mood and Affect: Mood normal.        Behavior: Behavior normal.        Thought Content: Thought content normal.        Judgment: Judgment normal.   FHR: 156  Reassurance given, discussed expected progression of Covid  illness and warning signs that warrant return for fluids/oxygen etc. Gave safe med list and encouraged her to go home to hydrate, rest and medicate her symptoms. Pt expressed understanding.  A Covid-19 Medical screening exam complete [redacted] weeks gestation Fetal heart tones present  P Discharge from MAU in stable condition with return precautions Follow up at [facility] as scheduled for ongoing prenatal care Warning signs for worsening condition that would warrant emergency follow-up discussed  [facility], CNM 11/04/2021 3:51 PM

## 2021-11-24 ENCOUNTER — Ambulatory Visit (INDEPENDENT_AMBULATORY_CARE_PROVIDER_SITE_OTHER): Payer: BC Managed Care – PPO | Admitting: Family Medicine

## 2021-11-24 ENCOUNTER — Other Ambulatory Visit: Payer: Self-pay

## 2021-11-24 VITALS — BP 129/85 | HR 95 | Wt 241.7 lb

## 2021-11-24 DIAGNOSIS — Z3490 Encounter for supervision of normal pregnancy, unspecified, unspecified trimester: Secondary | ICD-10-CM | POA: Diagnosis not present

## 2021-11-24 DIAGNOSIS — Z3A16 16 weeks gestation of pregnancy: Secondary | ICD-10-CM

## 2021-11-24 DIAGNOSIS — O2341 Unspecified infection of urinary tract in pregnancy, first trimester: Secondary | ICD-10-CM

## 2021-11-24 LAB — POCT URINALYSIS DIP (DEVICE)
Bilirubin Urine: NEGATIVE
Glucose, UA: NEGATIVE mg/dL
Hgb urine dipstick: NEGATIVE
Ketones, ur: NEGATIVE mg/dL
Leukocytes,Ua: NEGATIVE
Nitrite: NEGATIVE
Protein, ur: NEGATIVE mg/dL
Specific Gravity, Urine: 1.025 (ref 1.005–1.030)
Urobilinogen, UA: 0.2 mg/dL (ref 0.0–1.0)
pH: 6 (ref 5.0–8.0)

## 2021-11-24 MED ORDER — ALBUTEROL SULFATE HFA 108 (90 BASE) MCG/ACT IN AERS: 2.0000 | INHALATION_SPRAY | Freq: Four times a day (QID) | RESPIRATORY_TRACT | 1 refills | Status: DC | PRN

## 2021-11-24 MED ORDER — ASPIRIN EC 81 MG PO TBEC: 81.0000 mg | DELAYED_RELEASE_TABLET | Freq: Every day | ORAL | 2 refills | Status: AC

## 2021-11-24 MED ORDER — DEXTROMETHORPHAN HBR 15 MG/5ML PO SYRP: 10.0000 mL | ORAL_SOLUTION | Freq: Every day | ORAL | 0 refills | Status: AC | PRN

## 2021-11-24 NOTE — Progress Notes (Signed)
    Subjective:  Suzanne Elliott is a 25 y.o. [id number] at [redacted]w[redacted]d being seen today for ongoing prenatal care.  She is currently monitored for the following issues for this low-risk pregnancy and has Hydrosalpinx; Encounter for supervision of normal first pregnancy in first trimester; Hx of preeclampsia, prior pregnancy, currently pregnant, unspecified trimester; Postpartum hemorrhage; Chronic hypertension; and UTI (urinary tract infection) during pregnancy, first trimester on their problem list.  Patient reports  shortness of breath/cough .  Contractions: Not present. Vag. Bleeding: None.  Movement: Absent. Denies leaking of fluid.   The following portions of the patient's history were reviewed and updated as appropriate: allergies, current medications, past family history, past medical history, past social history, past surgical history and problem list. Problem list updated.  Objective:   Vitals:   11/24/21 1435 11/24/21 1531  BP: 129/85   Pulse: (!) 106 95  Weight: 241 lb 11.2 oz (109.6 kg)   O2 sat: 99-100%  Fetal Status: Fetal Heart Rate (bpm): 149   Movement: Absent     General:  Alert, oriented and cooperative. Patient is in no acute distress.  Skin: Skin is warm and dry. No rash noted.   Cardiovascular: Normal heart rate noted  Respiratory: Normal respiratory effort, no problems with respiration noted  Abdomen: Soft, gravid, appropriate for gestational age. Pain/Pressure: Absent     Pelvic: Vag. Bleeding: None     Cervical exam deferred        Extremities: Normal range of motion.  Edema: None  Mental Status: Normal mood and affect. Normal behavior. Normal judgment and thought content.    Assessment and Plan:  Pregnancy: [id number] at [redacted]w[redacted]d  1. Encounter for supervision of low-risk pregnancy, antepartum 2. [redacted] weeks gestation of pregnancy Doing well. Fetal heart tine normal. AFP offered, declines. Has anatomy US scheduled in early Jan  2. UTI (urinary tract infection) during  pregnancy, first trimester No symptoms at this time Treated with Macrobid on 11/18. UA today normal - Will get repeat culture for test of cure   3. Cough and intermittent shortness of breath Lingering after COVID on 10/2021 Somewhat improved but continues Continues to have cough  Worse when active  Denies fevers and chills Normal vitals with O2 sat 100% and HR in 90s on recheck As a child had inhaler for cold and exercise. Will trial albuterol while in post COVID phase. Also discussed supportive care including hot water/tea and drops of honey and cough drop Sent Dextromethorphan to pharmacy PRN  4. cHTN Patient with hx of cHTN not on meds. Needed medications in prior pregnancy.  ASA prescribed but not taking. Discussed starting to take again and patient amenable.  ASA sent to patient's pharmacy Has BP machine at home  Preterm labor symptoms and general obstetric precautions including but not limited to vaginal bleeding, contractions, leaking of fluid and fetal movement were reviewed in detail with the patient. Please refer to After Visit Summary for other counseling recommendations.  Return in about 4 weeks (around 12/22/2021) for LROB, any provider.  Warner Mccreedy, MD, MPH OB Fellow, Faculty Practice

## 2021-11-26 LAB — CULTURE, OB URINE

## 2021-11-26 LAB — URINE CULTURE, OB REFLEX

## 2021-12-17 ENCOUNTER — Other Ambulatory Visit: Payer: Self-pay | Admitting: Obstetrics and Gynecology

## 2021-12-17 ENCOUNTER — Other Ambulatory Visit: Payer: Self-pay | Admitting: *Deleted

## 2021-12-17 ENCOUNTER — Ambulatory Visit: Payer: BC Managed Care – PPO | Attending: Obstetrics and Gynecology

## 2021-12-17 ENCOUNTER — Other Ambulatory Visit: Payer: Self-pay

## 2021-12-17 DIAGNOSIS — O09292 Supervision of pregnancy with other poor reproductive or obstetric history, second trimester: Secondary | ICD-10-CM | POA: Insufficient documentation

## 2021-12-17 DIAGNOSIS — O10019 Pre-existing essential hypertension complicating pregnancy, unspecified trimester: Secondary | ICD-10-CM | POA: Insufficient documentation

## 2021-12-17 DIAGNOSIS — Z3401 Encounter for supervision of normal first pregnancy, first trimester: Secondary | ICD-10-CM | POA: Diagnosis not present

## 2021-12-17 DIAGNOSIS — Z363 Encounter for antenatal screening for malformations: Secondary | ICD-10-CM | POA: Insufficient documentation

## 2021-12-17 DIAGNOSIS — O99212 Obesity complicating pregnancy, second trimester: Secondary | ICD-10-CM | POA: Insufficient documentation

## 2021-12-17 DIAGNOSIS — O10912 Unspecified pre-existing hypertension complicating pregnancy, second trimester: Secondary | ICD-10-CM

## 2021-12-17 DIAGNOSIS — Z3A19 19 weeks gestation of pregnancy: Secondary | ICD-10-CM | POA: Diagnosis not present

## 2021-12-22 ENCOUNTER — Other Ambulatory Visit: Payer: Self-pay

## 2021-12-22 ENCOUNTER — Ambulatory Visit (INDEPENDENT_AMBULATORY_CARE_PROVIDER_SITE_OTHER): Payer: BC Managed Care – PPO | Admitting: Family Medicine

## 2021-12-22 VITALS — BP 125/67 | HR 100 | Wt 242.8 lb

## 2021-12-22 DIAGNOSIS — Z3A20 20 weeks gestation of pregnancy: Secondary | ICD-10-CM

## 2021-12-22 DIAGNOSIS — I10 Essential (primary) hypertension: Secondary | ICD-10-CM

## 2021-12-22 DIAGNOSIS — O0992 Supervision of high risk pregnancy, unspecified, second trimester: Secondary | ICD-10-CM

## 2021-12-22 DIAGNOSIS — E669 Obesity, unspecified: Secondary | ICD-10-CM

## 2021-12-22 NOTE — Progress Notes (Signed)
°  Subjective:  Suzanne Elliott is a 26 y.o. [id number] at [redacted]w[redacted]d being seen today for ongoing prenatal care.  She is currently monitored for the following issues for this high-risk pregnancy and has Hydrosalpinx; Encounter for supervision of normal first pregnancy in first trimester; Hx of preeclampsia, prior pregnancy, currently pregnant, unspecified trimester; Postpartum hemorrhage; Chronic hypertension; and UTI (urinary tract infection) during pregnancy, first trimester on their problem list.  Patient reports no complaints.  Contractions: Not present. Vag. Bleeding: None.  Movement: Present. Denies leaking of fluid.   The following portions of the patient's history were reviewed and updated as appropriate: allergies, current medications, past family history, past medical history, past social history, past surgical history and problem list. Problem list updated.  Objective:   Vitals:   12/22/21 1541  BP: 125/67  Pulse: 100  Weight: 242 lb 12.8 oz (110.1 kg)    Fetal Status: Fetal Heart Rate (bpm): 159 Fundal Height: 19 cm Movement: Present     General:  Alert, oriented and cooperative. Patient is in no acute distress.  Skin: Skin is warm and dry. No rash noted.   Cardiovascular: Normal heart rate noted  Respiratory: Normal respiratory effort, no problems with respiration noted  Abdomen: Soft, gravid, appropriate for gestational age. Pain/Pressure: Absent     Pelvic: Vag. Bleeding: None     Cervical exam deferred        Extremities: Normal range of motion.  Edema: None  Mental Status: Normal mood and affect. Normal behavior. Normal judgment and thought content.    Assessment and Plan:  Pregnancy: [id number] at [redacted]w[redacted]d  1. Encounter for supervision of high risk pregnancy in second trimester, antepartum 2. [redacted] weeks gestation of pregnancy Doing well. No acute concerns today. Heart tones and fundal height normal. Had questions about anatomy US report. Discussed that all parts of anatomy that  were visualized were normal. And that she has a follow up scheduled did not get good views of RVOT. - follow up in 4 weeks   3. Chronic hypertension Current BP normotensive. Has BP cuff at home. Also taking ASA daily - continue ASA  4. BMI >40 in pregnancy Normal A1C in initial prenatal labs. Patient would like to meet with nutritionist regarding dietary changes that she can make given her BMI  - referral placed for nutrition counseling  Preterm labor symptoms and general obstetric precautions including but not limited to vaginal bleeding, contractions, leaking of fluid and fetal movement were reviewed in detail with the patient. Please refer to After Visit Summary for other counseling recommendations.  Return in about 4 weeks (around 01/19/2022) for HROB, any MD/DO, also make appt with Marylene Land for nutrition.  Warner Mccreedy, MD, MPH OB Fellow, Faculty Practice

## 2022-01-08 ENCOUNTER — Other Ambulatory Visit: Payer: BC Managed Care – PPO | Admitting: Registered"

## 2022-01-08 ENCOUNTER — Other Ambulatory Visit: Payer: Self-pay

## 2022-01-08 DIAGNOSIS — E669 Obesity, unspecified: Secondary | ICD-10-CM

## 2022-01-14 ENCOUNTER — Ambulatory Visit: Payer: BC Managed Care – PPO | Admitting: *Deleted

## 2022-01-14 ENCOUNTER — Other Ambulatory Visit: Payer: Self-pay

## 2022-01-14 ENCOUNTER — Ambulatory Visit: Payer: BC Managed Care – PPO | Attending: Obstetrics and Gynecology

## 2022-01-14 VITALS — BP 133/69 | HR 95

## 2022-01-14 DIAGNOSIS — O10912 Unspecified pre-existing hypertension complicating pregnancy, second trimester: Secondary | ICD-10-CM | POA: Insufficient documentation

## 2022-01-14 DIAGNOSIS — O10012 Pre-existing essential hypertension complicating pregnancy, second trimester: Secondary | ICD-10-CM

## 2022-01-14 DIAGNOSIS — E669 Obesity, unspecified: Secondary | ICD-10-CM | POA: Diagnosis not present

## 2022-01-14 DIAGNOSIS — O99212 Obesity complicating pregnancy, second trimester: Secondary | ICD-10-CM | POA: Diagnosis not present

## 2022-01-14 DIAGNOSIS — Z3A23 23 weeks gestation of pregnancy: Secondary | ICD-10-CM

## 2022-01-14 DIAGNOSIS — O09292 Supervision of pregnancy with other poor reproductive or obstetric history, second trimester: Secondary | ICD-10-CM | POA: Diagnosis not present

## 2022-01-15 ENCOUNTER — Other Ambulatory Visit: Payer: Self-pay | Admitting: *Deleted

## 2022-01-15 DIAGNOSIS — O09299 Supervision of pregnancy with other poor reproductive or obstetric history, unspecified trimester: Secondary | ICD-10-CM

## 2022-01-15 DIAGNOSIS — Z6841 Body Mass Index (BMI) 40.0 and over, adult: Secondary | ICD-10-CM

## 2022-01-15 DIAGNOSIS — O10912 Unspecified pre-existing hypertension complicating pregnancy, second trimester: Secondary | ICD-10-CM

## 2022-01-16 ENCOUNTER — Ambulatory Visit: Payer: BC Managed Care – PPO | Admitting: Registered"

## 2022-01-16 NOTE — Progress Notes (Incomplete)
Medical Nutrition Therapy  Appointment Start time:  ***  Appointment End time:  ***  Primary concerns today: ***  Referral diagnosis: O99.212 (ICD-10-CM) - Obesity complicating pregnancy, second trimester Preferred learning style: *** (auditory, visual, hands on, no preference indicated) Learning readiness: *** (not ready, contemplating, ready, change in progress)   NUTRITION ASSESSMENT   Anthropometrics  ***   Clinical Medical Hx: *** Medications: *** Labs: *** Notable Signs/Symptoms: ***  Lifestyle & Dietary Hx ***  Estimated daily fluid intake: *** oz Supplements: *** Sleep: *** Stress / self-care: *** Current average weekly physical activity: ***  24-Hr Dietary Recall First Meal: *** Snack: *** Second Meal: *** Snack: *** Third Meal: *** Snack: *** Beverages: ***  Estimated Energy Needs Calories: *** Carbohydrate: ***g Protein: ***g Fat: ***g   NUTRITION DIAGNOSIS  {CHL AMB NUTRITIONAL DIAGNOSIS:[phone]}   NUTRITION INTERVENTION  Nutrition education (E-1) on the following topics:  ***  Handouts Provided Include  ***  Learning Style & Readiness for Change Teaching method utilized: Visual & Auditory  Demonstrated degree of understanding via: Teach Back  Barriers to learning/adherence to lifestyle change: ***  Goals Established by Pt ***   MONITORING & EVALUATION Dietary intake, weekly physical activity, and *** in ***.  Next Steps  Patient is to ***.

## 2022-01-19 ENCOUNTER — Ambulatory Visit (INDEPENDENT_AMBULATORY_CARE_PROVIDER_SITE_OTHER): Payer: BC Managed Care – PPO | Admitting: Obstetrics and Gynecology

## 2022-01-19 ENCOUNTER — Encounter: Payer: Self-pay | Admitting: Obstetrics and Gynecology

## 2022-01-19 ENCOUNTER — Other Ambulatory Visit: Payer: Self-pay

## 2022-01-19 ENCOUNTER — Ambulatory Visit (INDEPENDENT_AMBULATORY_CARE_PROVIDER_SITE_OTHER): Payer: BC Managed Care – PPO | Admitting: Clinical

## 2022-01-19 VITALS — BP 138/78 | HR 107 | Wt 246.9 lb

## 2022-01-19 DIAGNOSIS — F419 Anxiety disorder, unspecified: Secondary | ICD-10-CM

## 2022-01-19 DIAGNOSIS — O99342 Other mental disorders complicating pregnancy, second trimester: Secondary | ICD-10-CM | POA: Diagnosis not present

## 2022-01-19 DIAGNOSIS — O10012 Pre-existing essential hypertension complicating pregnancy, second trimester: Secondary | ICD-10-CM | POA: Diagnosis not present

## 2022-01-19 DIAGNOSIS — O99512 Diseases of the respiratory system complicating pregnancy, second trimester: Secondary | ICD-10-CM

## 2022-01-19 DIAGNOSIS — J45909 Unspecified asthma, uncomplicated: Secondary | ICD-10-CM

## 2022-01-19 DIAGNOSIS — O99341 Other mental disorders complicating pregnancy, first trimester: Secondary | ICD-10-CM

## 2022-01-19 DIAGNOSIS — I10 Essential (primary) hypertension: Secondary | ICD-10-CM | POA: Diagnosis not present

## 2022-01-19 DIAGNOSIS — Z3401 Encounter for supervision of normal first pregnancy, first trimester: Secondary | ICD-10-CM

## 2022-01-19 DIAGNOSIS — Z3A24 24 weeks gestation of pregnancy: Secondary | ICD-10-CM | POA: Diagnosis not present

## 2022-01-19 MED ORDER — FAMOTIDINE 20 MG PO TABS: 20.0000 mg | ORAL_TABLET | Freq: Two times a day (BID) | ORAL | 3 refills | Status: AC

## 2022-01-19 MED ORDER — FLUTICASONE PROPIONATE HFA 44 MCG/ACT IN AERO: 2.0000 | INHALATION_SPRAY | Freq: Two times a day (BID) | RESPIRATORY_TRACT | 12 refills | Status: AC

## 2022-01-19 MED ORDER — MONTELUKAST SODIUM 10 MG PO TABS: 10.0000 mg | ORAL_TABLET | Freq: Every day | ORAL | 1 refills | Status: AC

## 2022-01-19 NOTE — Progress Notes (Addendum)
° °  PRENATAL VISIT NOTE  Subjective:  Suzanne Elliott is a 26 y.o. [id number] at [redacted]w[redacted]d being seen today for ongoing prenatal care.  She is currently monitored for the following issues for this high-risk pregnancy and has Hydrosalpinx; Encounter for supervision of normal first pregnancy in first trimester; Hx of preeclampsia, prior pregnancy, currently pregnant, unspecified trimester; Postpartum hemorrhage; Chronic hypertension; UTI (urinary tract infection) during pregnancy, first trimester; and Asthma affecting pregnancy in second trimester on their problem list.  Patient reports  increased anxiety with some sob  .  Contractions: Not present. Vag. Bleeding: None.  Movement: Present. Denies leaking of fluid.   The following portions of the patient's history were reviewed and updated as appropriate: allergies, current medications, past family history, past medical history, past social history, past surgical history and problem list.   Objective:   Vitals:   01/19/22 1533  BP: 138/78  Pulse: (!) 107  Weight: 246 lb 14.4 oz (112 kg)    Fetal Status: Fetal Heart Rate (bpm): 147   Movement: Present     General:  Alert, oriented and cooperative. Patient is in no acute distress.  Skin: Skin is warm and dry. No rash noted.   Cardiovascular: Normal heart rate noted, normal s1 and s2, no mrgs  Respiratory: Ctab, Normal respiratory effort, no problems with respiration noted  Abdomen: Soft, gravid, appropriate for gestational age.  Pain/Pressure: Absent     Pelvic: Cervical exam deferred        Extremities: Normal range of motion.  Edema: Trace  Mental Status: Normal mood and affect. Normal behavior. Normal judgment and thought content.   Assessment and Plan:  Pregnancy: [id number] at [redacted]w[redacted]d 1. Encounter for supervision of normal first pregnancy in first trimester 2h GTT nv Pt strongly considering BTL. Papers signed today 2-1: efw 41%, ac 39%>> f/u 3/8 rpt growth  2. [redacted] weeks gestation of  pregnancy  3. Asthma affecting pregnancy in second trimester Pt with gerd, no allergy s/s. She tried the albuterol but it made her heart race. Will try pepcid, singulair and flovent. If no improvement, consider cardiac work up.   4. Chronic hypertension Doing well on no meds  5. Anxiety in pregnancy in first trimester, antepartum May be causing/contributing to sob. Pt unsure why she's so anxious; she is ameanble to seeing jaimie today  Preterm labor symptoms and general obstetric precautions including but not limited to vaginal bleeding, contractions, leaking of fluid and fetal movement were reviewed in detail with the patient. Please refer to After Visit Summary for other counseling recommendations.   Return in about 2 weeks (around 02/02/2022) for in person, fasting 2hr GTT, high risk ob, md visit.  Future Appointments  Date Time Provider [place]  02/18/2022  3:00 PM [facility] NURSE [facility]  02/18/2022  3:15 PM [facility] US2 [facility]    Aletha Halim, MD

## 2022-01-19 NOTE — Progress Notes (Signed)
Sent Pts chart to front desk to be scheduled with Nutrition with Marylene Land.

## 2022-01-19 NOTE — BH Specialist Note (Signed)
Integrated Behavioral Health Initial In-Person Visit  MRN: [id number] Name: Suzanne Elliott  Number of Integrated Behavioral Health Clinician visits:: 1/6 Session Start time: 4:13 Session End time: 4:34 Total time:  21  minutes  Types of Service: Individual psychotherapy  Interpretor:No. Interpretor Name and Language: n/a   Warm Hand Off Completed.        Subjective: Suzanne Elliott is a 26 y.o. female accompanied by  n/a Patient was referred by Mackey Bing, MD for anxiety. Patient reports the following symptoms/concerns: Increase in anxiety and worry in current pregnancy, attributed to stress over mother's health and daughter's school difficulty. Duration of problem: Increase in current pregnancy; Severity of problem: severe  Objective: Mood: Anxious and Affect: Appropriate Risk of harm to self or others: No plan to harm self or others  Life Context: Family and Social: Pt lives with children (3yo son; 6yo daughter) School/Work: - Self-Care: - Life Changes: Current pregnancy  Patient and/or Family's Strengths/Protective Factors: Sense of purpose  Goals Addressed: Patient will: Reduce symptoms of: anxiety, depression, and stress Increase knowledge and/or ability of: coping skills and stress reduction  Demonstrate ability to: Increase healthy adjustment to current life circumstances  Progress towards Goals: Ongoing  Interventions: Interventions utilized: CBT Cognitive Behavioral Therapy  Standardized Assessments completed: GAD-7 and PHQ 9  Patient and/or Family Response: Pt agrees with treatment plan; will return in two weeks for virtual visit  Patient Centered Plan: Patient is on the following Treatment Plan(s):  IBH  Assessment: Patient currently experiencing Anxiety disorder, unspecified.   Patient may benefit from psychoeducation and brief therapeutic interventions regarding coping with symptoms of anxiety, depression, life stress .  Plan: Follow up  with behavioral health clinician on : Two weeks Behavioral recommendations:  -Continue taking prenatal vitamin as prescribed -Practice Worry Time strategy, as discussed, for two weeks; will discuss results at follow up visit -Continue using sleep sounds at night for improved family sleep (consider sleep sound app on After Visit Summary, as needed) Referral(s): Integrated [facility] (In Clinic)  [address] [place], [place]  Depression screen [facility] 2/9 01/19/2022 12/22/2021 11/24/2021 10/27/2021  Decreased Interest 0 0 1 1  Down, Depressed, Hopeless 1 1 0 1  PHQ - 2 Score 1 1 1 2   Altered sleeping 2 - 2 3  Tired, decreased energy 3 0 1 2  Change in appetite 0 2 0 0  Feeling bad or failure about yourself  1 2 0 0  Trouble concentrating 0 0 0 0  Moving slowly or fidgety/restless 3 0 0 0  Suicidal thoughts 0 0 0 0  PHQ-9 Score 10 - 4 7   GAD 7 : Generalized Anxiety Score 01/19/2022 12/22/2021 11/24/2021 10/27/2021  Nervous, Anxious, on Edge 3 1 1  0  Control/stop worrying 3 2 1 1   Worry too much - different things 3 2 1 1   Trouble relaxing 3 1 2  0  Restless 3 0 - 0  Easily annoyed or irritable 3 3 3  0  Afraid - awful might happen 3 1 2  0  Total GAD 7 Score 21 10 - 2

## 2022-01-19 NOTE — Progress Notes (Signed)
Pt states has been having shortness of breath a lot, was given inhaler but it increases her heart rate. Oxygen level today is 99

## 2022-01-20 NOTE — BH Specialist Note (Signed)
Pt did not arrive to video visit and did not answer the phone; Left HIPPA-compliant message to call back Jahmel Flannagan from [facility] at  [phone] (Merary Garguilo's office).  ?; left MyChart message for patient.  ? ?

## 2022-02-03 ENCOUNTER — Ambulatory Visit: Payer: BC Managed Care – PPO | Admitting: Clinical

## 2022-02-03 DIAGNOSIS — Z91199 Patient's noncompliance with other medical treatment and regimen due to unspecified reason: Secondary | ICD-10-CM

## 2022-02-03 NOTE — Progress Notes (Unsigned)
° °  PRENATAL VISIT NOTE  Subjective:  Suzanne Elliott is a 26 y.o. [id number] at [redacted]w[redacted]d being seen today for ongoing prenatal care.  She is currently monitored for the following issues for this high-risk pregnancy and has Hydrosalpinx; Encounter for supervision of normal first pregnancy in first trimester; Hx of preeclampsia, prior pregnancy, currently pregnant, unspecified trimester; Postpartum hemorrhage; Chronic hypertension; UTI (urinary tract infection) during pregnancy, first trimester; and Asthma affecting pregnancy in second trimester on their problem list.  Patient reports {[id number]}.   .  .   . Denies leaking of fluid.   The following portions of the patient's history were reviewed and updated as appropriate: allergies, current medications, past family history, past medical history, past social history, past surgical history and problem list.   Objective:  There were no vitals filed for this visit.  Fetal Status:           General:  Alert, oriented and cooperative. Patient is in no acute distress.  Skin: Skin is warm and dry. No rash noted.   Cardiovascular: Normal heart rate noted  Respiratory: Normal respiratory effort, no problems with respiration noted  Abdomen: Soft, gravid, appropriate for gestational age.        Pelvic: Cervical exam deferred        Extremities: Normal range of motion.     Mental Status: Normal mood and affect. Normal behavior. Normal judgment and thought content.   Assessment and Plan:  Pregnancy: [id number] at [redacted]w[redacted]d 1. Chronic hypertension - BP remains normal off meds - Next growth is 3/8 - normal growth thus far. Continue serially.   2. Asthma affecting pregnancy in second trimester - Was started on flovent, singulair and pepcid last appt - ***  3. Encounter for supervision of normal first pregnancy in first trimester - 28 wk labs today - Tdap next appt  4. Hx of preeclampsia, prior pregnancy, currently pregnant, unspecified trimester - Baseline labs  normal, continue ldASA  Preterm labor symptoms and general obstetric precautions including but not limited to vaginal bleeding, contractions, leaking of fluid and fetal movement were reviewed in detail with the patient. Please refer to After Visit Summary for other counseling recommendations.   No follow-ups on file.  Future Appointments  Date Time Provider [place]  02/03/2022  3:45 PM [facility]  02/04/2022  8:20 AM [facility]  02/04/2022  9:35 AM Radene Gunning, MD [facility]  02/05/2022  3:15 PM [facility]  02/18/2022  3:00 PM [facility] NURSE [facility]  02/18/2022  3:15 PM [facility] US2 [facility] [place]    Radene Gunning, MD

## 2022-02-04 ENCOUNTER — Other Ambulatory Visit: Payer: BC Managed Care – PPO

## 2022-02-04 ENCOUNTER — Other Ambulatory Visit: Payer: Self-pay

## 2022-02-04 ENCOUNTER — Encounter: Payer: BC Managed Care – PPO | Admitting: Obstetrics and Gynecology

## 2022-02-04 DIAGNOSIS — J45909 Unspecified asthma, uncomplicated: Secondary | ICD-10-CM

## 2022-02-04 DIAGNOSIS — O09299 Supervision of pregnancy with other poor reproductive or obstetric history, unspecified trimester: Secondary | ICD-10-CM

## 2022-02-04 DIAGNOSIS — Z3401 Encounter for supervision of normal first pregnancy, first trimester: Secondary | ICD-10-CM

## 2022-02-04 DIAGNOSIS — I10 Essential (primary) hypertension: Secondary | ICD-10-CM

## 2022-02-05 ENCOUNTER — Other Ambulatory Visit: Payer: BC Managed Care – PPO

## 2022-02-16 ENCOUNTER — Other Ambulatory Visit: Payer: BC Managed Care – PPO

## 2022-02-16 ENCOUNTER — Encounter: Payer: BC Managed Care – PPO | Admitting: Obstetrics & Gynecology

## 2022-02-16 ENCOUNTER — Telehealth: Payer: Self-pay

## 2022-02-16 NOTE — Telephone Encounter (Signed)
Patient called by Alesia Banda CMA to follow up on missed appt this AM. Call goes directly to voicemail; voicemail is full, unable to leave message.  ?

## 2022-02-18 ENCOUNTER — Ambulatory Visit: Payer: BC Managed Care – PPO

## 2022-02-24 ENCOUNTER — Other Ambulatory Visit: Payer: BC Managed Care – PPO

## 2022-02-25 ENCOUNTER — Other Ambulatory Visit: Payer: Self-pay

## 2022-02-25 ENCOUNTER — Ambulatory Visit: Payer: BC Managed Care – PPO

## 2022-02-25 ENCOUNTER — Ambulatory Visit (HOSPITAL_BASED_OUTPATIENT_CLINIC_OR_DEPARTMENT_OTHER): Payer: BC Managed Care – PPO

## 2022-02-25 ENCOUNTER — Ambulatory Visit: Payer: BC Managed Care – PPO | Attending: Maternal & Fetal Medicine | Admitting: *Deleted

## 2022-02-25 ENCOUNTER — Other Ambulatory Visit: Payer: Self-pay | Admitting: *Deleted

## 2022-02-25 VITALS — BP 137/80 | HR 98

## 2022-02-25 DIAGNOSIS — Z3A29 29 weeks gestation of pregnancy: Secondary | ICD-10-CM

## 2022-02-25 DIAGNOSIS — J45909 Unspecified asthma, uncomplicated: Secondary | ICD-10-CM

## 2022-02-25 DIAGNOSIS — O99513 Diseases of the respiratory system complicating pregnancy, third trimester: Secondary | ICD-10-CM

## 2022-02-25 DIAGNOSIS — O10913 Unspecified pre-existing hypertension complicating pregnancy, third trimester: Secondary | ICD-10-CM

## 2022-02-25 DIAGNOSIS — O09213 Supervision of pregnancy with history of pre-term labor, third trimester: Secondary | ICD-10-CM | POA: Insufficient documentation

## 2022-02-25 DIAGNOSIS — Z6841 Body Mass Index (BMI) 40.0 and over, adult: Secondary | ICD-10-CM

## 2022-02-25 DIAGNOSIS — O09293 Supervision of pregnancy with other poor reproductive or obstetric history, third trimester: Secondary | ICD-10-CM

## 2022-02-25 DIAGNOSIS — O09893 Supervision of other high risk pregnancies, third trimester: Secondary | ICD-10-CM

## 2022-02-25 DIAGNOSIS — O10013 Pre-existing essential hypertension complicating pregnancy, third trimester: Secondary | ICD-10-CM | POA: Diagnosis present

## 2022-02-25 DIAGNOSIS — O99213 Obesity complicating pregnancy, third trimester: Secondary | ICD-10-CM | POA: Diagnosis not present

## 2022-02-25 DIAGNOSIS — Z8616 Personal history of COVID-19: Secondary | ICD-10-CM

## 2022-02-25 DIAGNOSIS — O10912 Unspecified pre-existing hypertension complicating pregnancy, second trimester: Secondary | ICD-10-CM

## 2022-02-25 DIAGNOSIS — O09299 Supervision of pregnancy with other poor reproductive or obstetric history, unspecified trimester: Secondary | ICD-10-CM

## 2022-02-25 NOTE — Progress Notes (Unsigned)
Us/

## 2022-03-25 ENCOUNTER — Ambulatory Visit: Payer: BC Managed Care – PPO

## 2022-03-25 ENCOUNTER — Ambulatory Visit: Payer: BC Managed Care – PPO | Attending: Maternal & Fetal Medicine

## 2022-03-31 DIAGNOSIS — Z3A34 34 weeks gestation of pregnancy: Secondary | ICD-10-CM | POA: Diagnosis not present

## 2022-03-31 DIAGNOSIS — O26893 Other specified pregnancy related conditions, third trimester: Secondary | ICD-10-CM | POA: Diagnosis not present

## 2022-03-31 DIAGNOSIS — R102 Pelvic and perineal pain: Secondary | ICD-10-CM | POA: Diagnosis not present

## 2022-04-14 DIAGNOSIS — Z3A36 36 weeks gestation of pregnancy: Secondary | ICD-10-CM | POA: Diagnosis not present

## 2022-04-14 DIAGNOSIS — O99213 Obesity complicating pregnancy, third trimester: Secondary | ICD-10-CM | POA: Diagnosis not present

## 2022-04-14 DIAGNOSIS — O10013 Pre-existing essential hypertension complicating pregnancy, third trimester: Secondary | ICD-10-CM | POA: Diagnosis not present

## 2022-04-14 DIAGNOSIS — O99513 Diseases of the respiratory system complicating pregnancy, third trimester: Secondary | ICD-10-CM | POA: Diagnosis not present

## 2022-04-19 DIAGNOSIS — M79641 Pain in right hand: Secondary | ICD-10-CM | POA: Diagnosis not present

## 2022-04-19 DIAGNOSIS — O99891 Other specified diseases and conditions complicating pregnancy: Secondary | ICD-10-CM | POA: Diagnosis not present

## 2022-04-19 DIAGNOSIS — Z3A37 37 weeks gestation of pregnancy: Secondary | ICD-10-CM | POA: Diagnosis not present

## 2022-04-19 DIAGNOSIS — M7989 Other specified soft tissue disorders: Secondary | ICD-10-CM | POA: Diagnosis not present

## 2022-04-21 DIAGNOSIS — O10013 Pre-existing essential hypertension complicating pregnancy, third trimester: Secondary | ICD-10-CM | POA: Diagnosis not present

## 2022-04-21 DIAGNOSIS — Z3A36 36 weeks gestation of pregnancy: Secondary | ICD-10-CM | POA: Diagnosis not present

## 2022-04-21 DIAGNOSIS — O10919 Unspecified pre-existing hypertension complicating pregnancy, unspecified trimester: Secondary | ICD-10-CM | POA: Diagnosis not present

## 2022-04-27 DIAGNOSIS — O9952 Diseases of the respiratory system complicating childbirth: Secondary | ICD-10-CM | POA: Diagnosis not present

## 2022-04-27 DIAGNOSIS — Z8616 Personal history of COVID-19: Secondary | ICD-10-CM | POA: Diagnosis not present

## 2022-04-27 DIAGNOSIS — Z3483 Encounter for supervision of other normal pregnancy, third trimester: Secondary | ICD-10-CM | POA: Diagnosis not present

## 2022-04-27 DIAGNOSIS — O1002 Pre-existing essential hypertension complicating childbirth: Secondary | ICD-10-CM | POA: Diagnosis not present

## 2022-04-27 DIAGNOSIS — J45909 Unspecified asthma, uncomplicated: Secondary | ICD-10-CM | POA: Diagnosis not present

## 2022-04-27 DIAGNOSIS — Z3A38 38 weeks gestation of pregnancy: Secondary | ICD-10-CM | POA: Diagnosis not present

## 2022-04-27 DIAGNOSIS — O99214 Obesity complicating childbirth: Secondary | ICD-10-CM | POA: Diagnosis not present

## 2022-04-28 DIAGNOSIS — O1002 Pre-existing essential hypertension complicating childbirth: Secondary | ICD-10-CM | POA: Diagnosis not present

## 2022-04-28 DIAGNOSIS — Z3A40 40 weeks gestation of pregnancy: Secondary | ICD-10-CM | POA: Diagnosis not present

## 2022-04-28 DIAGNOSIS — Z3A38 38 weeks gestation of pregnancy: Secondary | ICD-10-CM | POA: Diagnosis not present

## 2022-05-08 ENCOUNTER — Inpatient Hospital Stay (HOSPITAL_COMMUNITY): Admit: 2022-05-08 | Payer: Self-pay

## 2022-05-29 DIAGNOSIS — Z01818 Encounter for other preprocedural examination: Secondary | ICD-10-CM | POA: Diagnosis not present

## 2022-06-02 DIAGNOSIS — Z6841 Body Mass Index (BMI) 40.0 and over, adult: Secondary | ICD-10-CM | POA: Diagnosis not present

## 2022-06-02 DIAGNOSIS — Z302 Encounter for sterilization: Secondary | ICD-10-CM | POA: Diagnosis not present

## 2022-06-02 DIAGNOSIS — Z641 Problems related to multiparity: Secondary | ICD-10-CM | POA: Diagnosis not present

## 2022-06-02 DIAGNOSIS — Z8616 Personal history of COVID-19: Secondary | ICD-10-CM | POA: Diagnosis not present

## 2022-06-02 DIAGNOSIS — J45909 Unspecified asthma, uncomplicated: Secondary | ICD-10-CM | POA: Diagnosis not present

## 2022-06-02 DIAGNOSIS — E669 Obesity, unspecified: Secondary | ICD-10-CM | POA: Diagnosis not present

## 2023-09-16 IMAGING — US US MFM OB FOLLOW-UP
1 series · 13 of 28 positions shown · non-contrast
Comparison: none

[Series 1: us mfm ob follow-up · 13 of 67 slices shown]
[im 3/67]
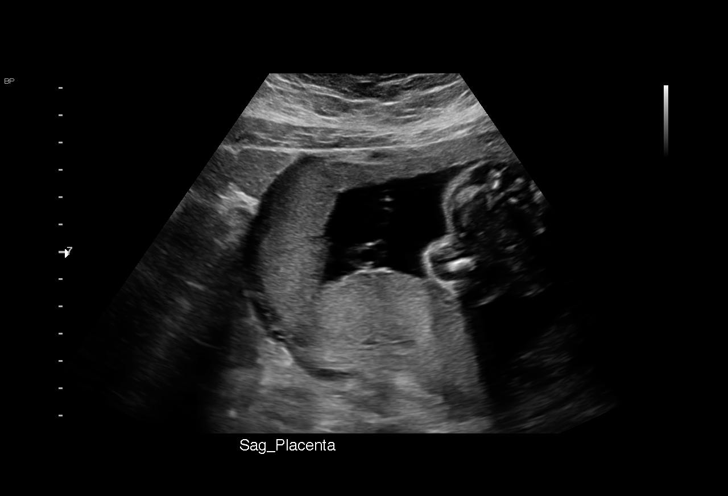
[im 8/67]
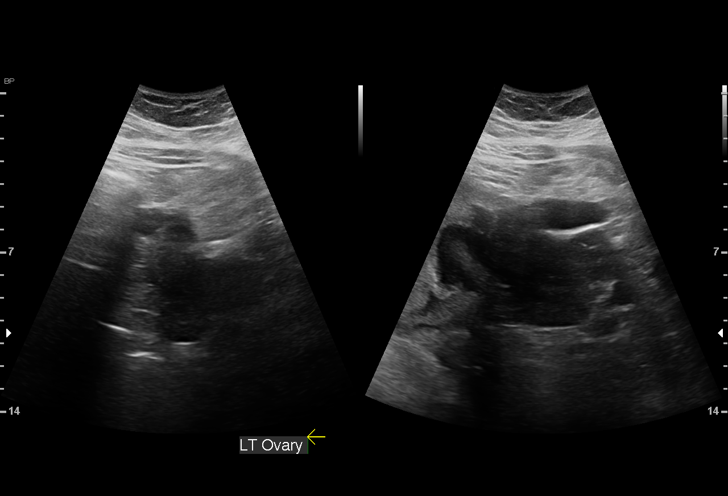
[im 13/67]
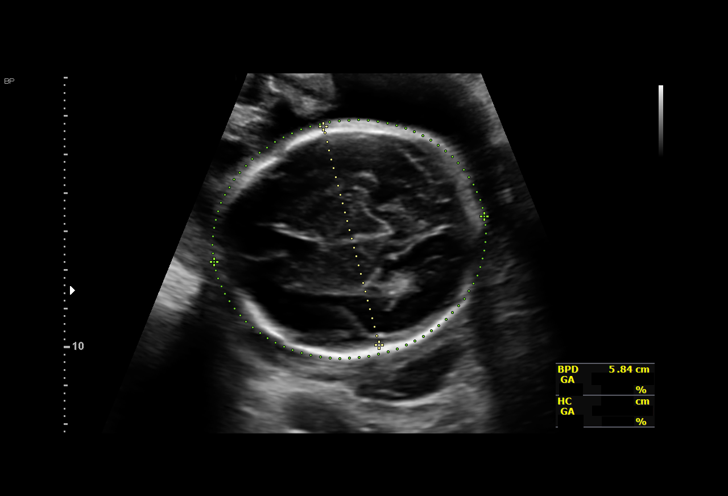
[im 18/67]
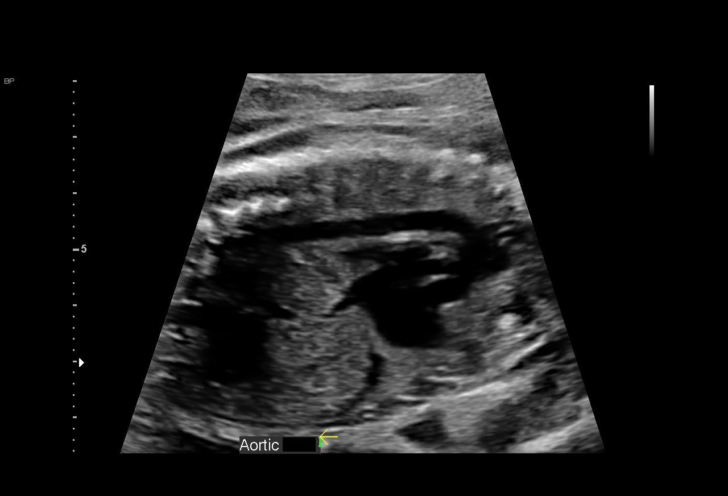
[im 23/67]
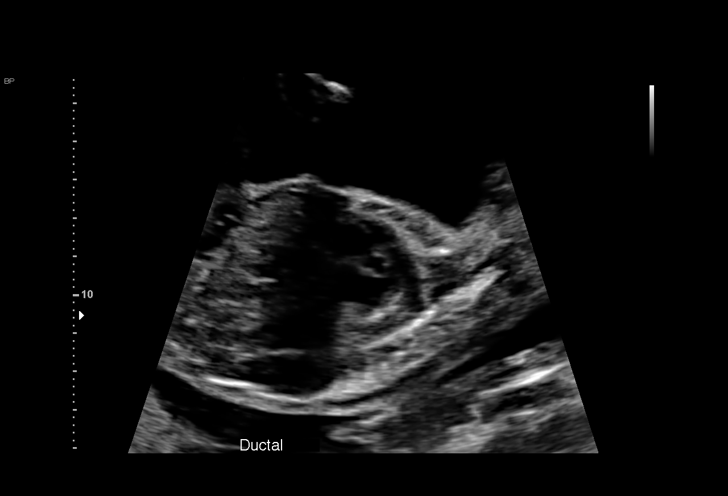
[im 27/67]
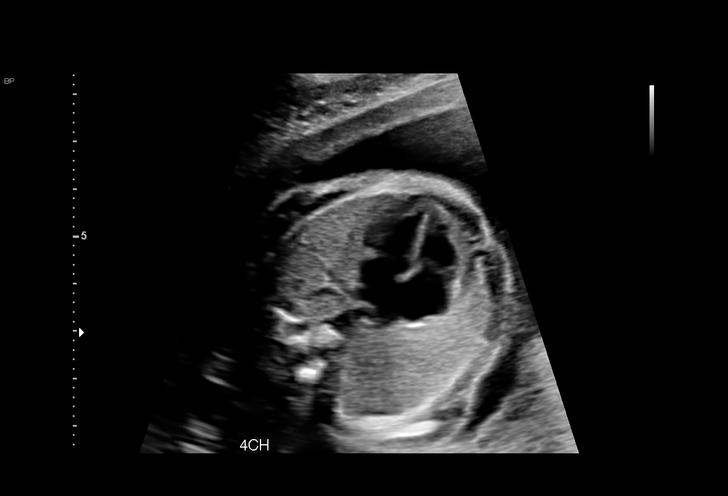
[im 35/67]
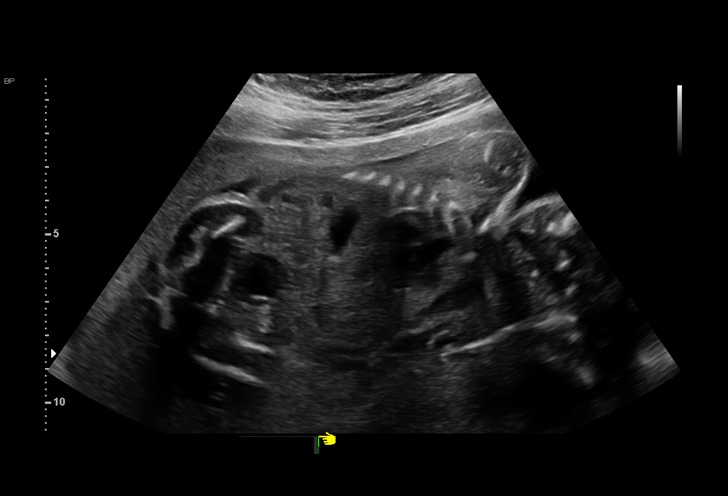
[im 40/67]
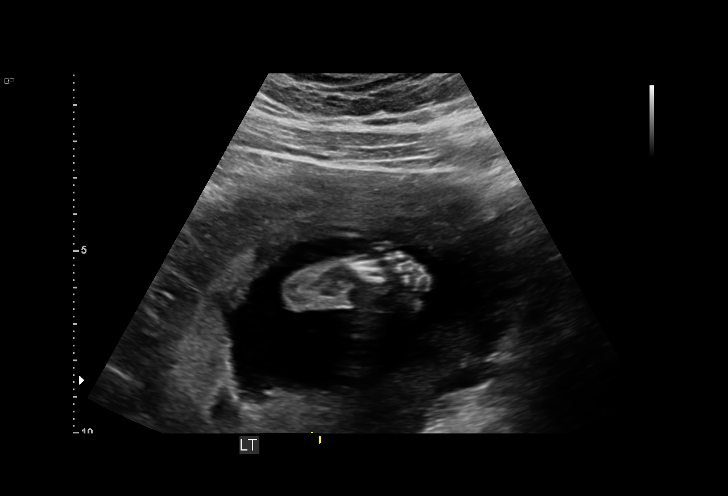
[im 45/67]
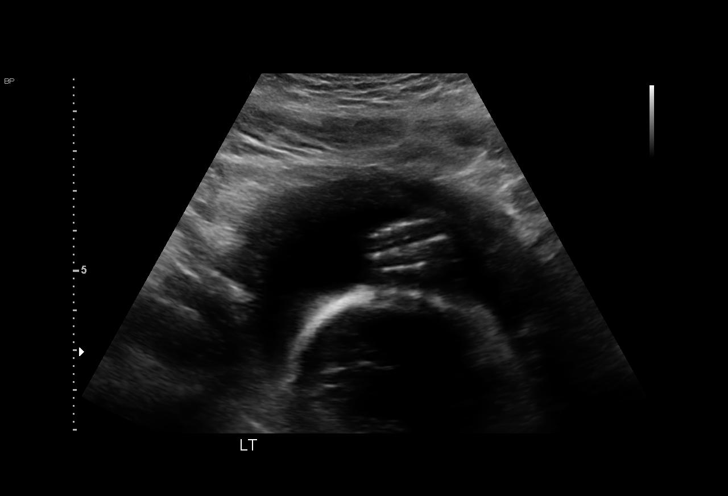
[im 49/67]
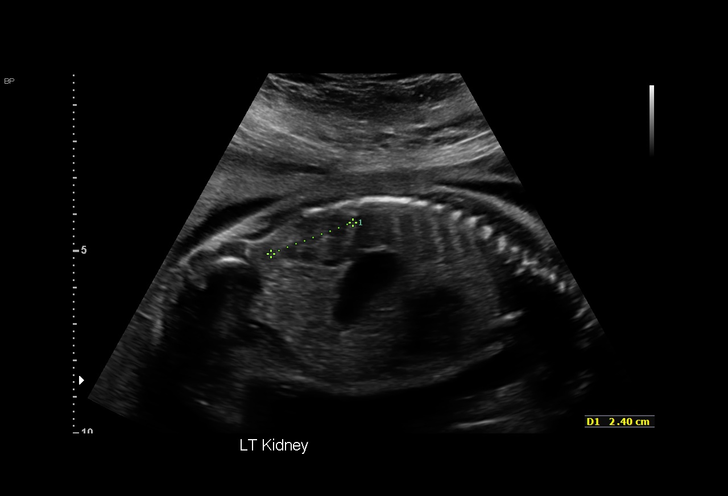
[im 54/67]
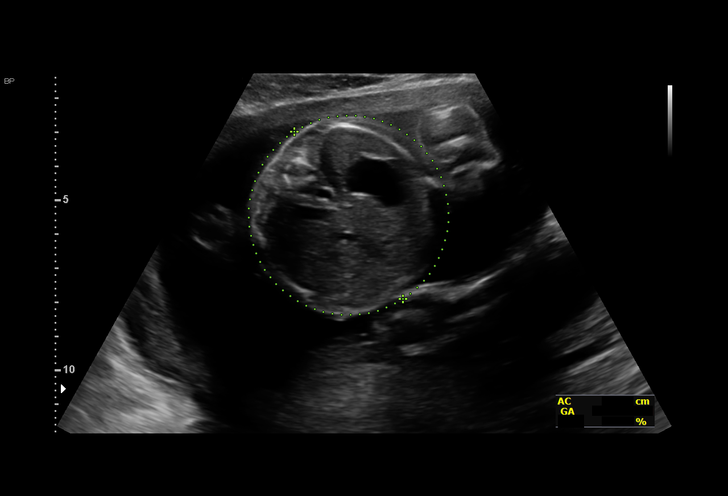
[im 59/67]
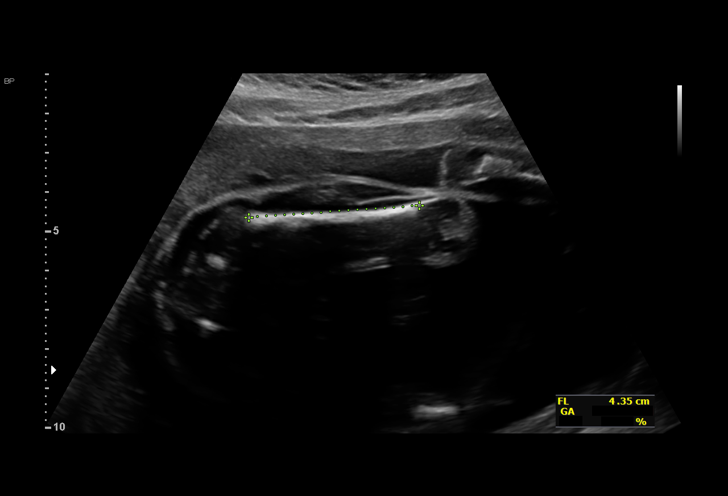
[im 64/67]
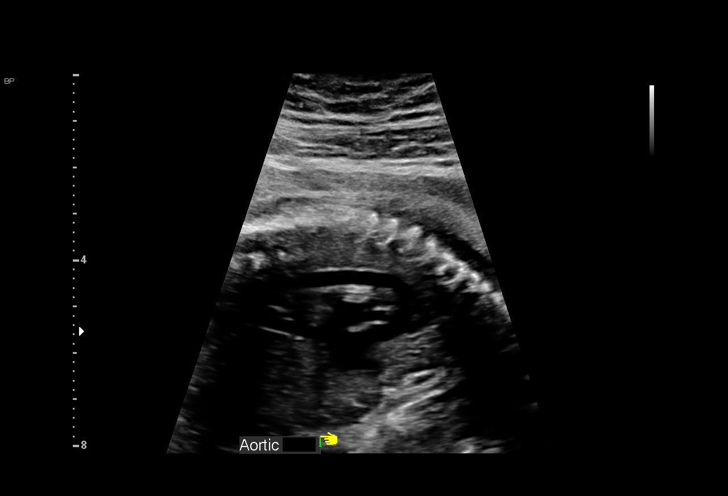

[13 of 28 positions shown; findings below may reference images not displayed]

Indications

 Hypertension - Chronic/Pre-existing
 Obesity complicating pregnancy, second
 trimester BMI 42
 Poor obstetric history: Previous
 preeclampsia / eclampsia/gestational HTN
 23 weeks gestation of pregnancy
Fetal Evaluation

 Num Of Fetuses:         1
 Fetal Heart Rate(bpm):  137
 Cardiac Activity:       Observed
 Presentation:           Cephalic
 Placenta:               Fundal
 P. Cord Insertion:      Previously Visualized

 Amniotic Fluid
 AFI FV:      Within normal limits

                             Largest Pocket(cm)

Biometry

 BPD:      58.4  mm     G. Age:  23w 6d         51  %    CI:        78.02   %    70 - 86
                                                         FL/HC:      20.4   %    18.7 -
 HC:      209.2  mm     G. Age:  23w 0d         12  %    HC/AC:      1.11        1.05 -
 AC:      189.1  mm     G. Age:  23w 5d         39  %    FL/BPD:     72.9   %    71 - 87
 FL:       42.6  mm     G. Age:  23w 6d         44  %    FL/AC:      22.5   %    20 - 24
 HUM:      37.9  mm     G. Age:  23w 2d         34  %
 CER:      25.3  mm     G. Age:  23w 0d         48  %

 LV:        5.5  mm
 CM:          4  mm
 Est. FW:     621  gm      1 lb 6 oz     41  %
OB History

 Gravidity:    4         Term:   1        Prem:   1        SAB:   1
 Living:       2
Gestational Age

 LMP:           23w 5d        Date:  08/01/21                 EDD:   05/08/22
 U/S Today:     23w 4d                                        EDD:   05/09/22
 Best:          23w 5d     Det. By:  LMP  (08/01/21)          EDD:   05/08/22
Anatomy

 Cranium:               Appears normal         Aortic Arch:            Appears normal
 Cavum:                 Appears normal         Ductal Arch:            Appears normal
 Ventricles:            Appears normal         Diaphragm:              Appears normal
 Choroid Plexus:        Appears normal         Stomach:                Appears normal, left
                                                                       sided
 Cerebellum:            Appears normal         Abdomen:                Previously seen
 Posterior Fossa:       Appears normal         Abdominal Wall:         Previously seen
 Nuchal Fold:           Not applicable (>20    Cord Vessels:           Appears normal (3
                        wks GA)                                        vessel cord)
 Face:                  Orbits and profile     Kidneys:                Appear normal
                        previously seen
 Lips:                  Previously seen        Bladder:                Appears normal
 Thoracic:              Appears normal         Spine:                  Previously seen
 Heart:                 Appears normal         Upper Extremities:      Previously seen
                        (4CH, axis, and
                        situs)
 RVOT:                  Appears normal         Lower Extremities:      Previously seen
 LVOT:                  Appears normal

 Other:  3VTV visualized. VC and 3VV previously visualized. Fetus appears to
         be female. Heels visualized. Open right hand visualized. Technically
         difficult due to maternal habitus and fetal position.
Cervix Uterus Adnexa

 Cervix
 Length:           3.74  cm.
 Normal appearance by transabdominal scan.

 Uterus
 Normal shape and size.

 Right Ovary
 Not visualized.

 Left Ovary
 Within normal limits.

 Cul De Sac
 No free fluid seen.
 Adnexa
 No adnexal mass visualized.
Impression

 Follow up growth due to elevated BMI and chronic
 hypertension.
 Normal interval growth with measurements consistent with
 dates
 Good fetal movement and amniotic fluid volume
Recommendations

 Follow up growth in 5 weeks.
